# Patient Record
Sex: Male | Born: 1980
Health system: Southern US, Community
[De-identification: ages and names within clinical notes are randomized; demographics above are authoritative.]

## PROBLEM LIST (undated history)

## (undated) DIAGNOSIS — E7800 Pure hypercholesterolemia, unspecified: Secondary | ICD-10-CM

## (undated) DIAGNOSIS — G4730 Sleep apnea, unspecified: Secondary | ICD-10-CM

## (undated) DIAGNOSIS — T7840XA Allergy, unspecified, initial encounter: Secondary | ICD-10-CM

## (undated) DIAGNOSIS — E669 Obesity, unspecified: Secondary | ICD-10-CM

## (undated) DIAGNOSIS — K219 Gastro-esophageal reflux disease without esophagitis: Secondary | ICD-10-CM

## (undated) DIAGNOSIS — J45909 Unspecified asthma, uncomplicated: Secondary | ICD-10-CM

## (undated) HISTORY — DX: Allergy, unspecified, initial encounter: T78.40XA

## (undated) HISTORY — DX: Obesity, unspecified: E66.9

## (undated) HISTORY — PX: KNEE ARTHROSCOPY: SUR90

## (undated) HISTORY — PX: WISDOM TOOTH EXTRACTION: SHX21

## (undated) HISTORY — DX: Gastro-esophageal reflux disease without esophagitis: K21.9

---

## 1998-10-20 ENCOUNTER — Emergency Department (HOSPITAL_COMMUNITY): Admission: EM | Admit: 1998-10-20 | Discharge: 1998-10-21 | Payer: Self-pay | Admitting: Emergency Medicine

## 1998-10-21 ENCOUNTER — Encounter: Payer: Self-pay | Admitting: Internal Medicine

## 2000-05-09 ENCOUNTER — Encounter: Payer: Self-pay | Admitting: Internal Medicine

## 2000-05-09 ENCOUNTER — Ambulatory Visit (HOSPITAL_COMMUNITY): Admission: RE | Admit: 2000-05-09 | Discharge: 2000-05-09 | Payer: Self-pay | Admitting: Internal Medicine

## 2000-09-17 ENCOUNTER — Emergency Department (HOSPITAL_COMMUNITY): Admission: EM | Admit: 2000-09-17 | Discharge: 2000-09-18 | Payer: Self-pay | Admitting: Emergency Medicine

## 2000-09-18 ENCOUNTER — Encounter: Payer: Self-pay | Admitting: Emergency Medicine

## 2001-06-16 ENCOUNTER — Encounter: Payer: Self-pay | Admitting: Emergency Medicine

## 2001-06-16 ENCOUNTER — Emergency Department (HOSPITAL_COMMUNITY): Admission: EM | Admit: 2001-06-16 | Discharge: 2001-06-16 | Payer: Self-pay | Admitting: Emergency Medicine

## 2001-06-18 ENCOUNTER — Emergency Department (HOSPITAL_COMMUNITY): Admission: EM | Admit: 2001-06-18 | Discharge: 2001-06-18 | Payer: Self-pay | Admitting: *Deleted

## 2001-06-20 ENCOUNTER — Emergency Department (HOSPITAL_COMMUNITY): Admission: EM | Admit: 2001-06-20 | Discharge: 2001-06-20 | Payer: Self-pay | Admitting: *Deleted

## 2001-06-26 ENCOUNTER — Emergency Department (HOSPITAL_COMMUNITY): Admission: EM | Admit: 2001-06-26 | Discharge: 2001-06-26 | Payer: Self-pay | Admitting: *Deleted

## 2001-10-12 ENCOUNTER — Encounter: Admission: RE | Admit: 2001-10-12 | Discharge: 2001-10-12 | Payer: Self-pay | Admitting: Internal Medicine

## 2001-10-12 ENCOUNTER — Encounter: Payer: Self-pay | Admitting: Internal Medicine

## 2002-11-25 ENCOUNTER — Emergency Department (HOSPITAL_COMMUNITY): Admission: EM | Admit: 2002-11-25 | Discharge: 2002-11-25 | Payer: Self-pay | Admitting: Emergency Medicine

## 2002-11-27 ENCOUNTER — Emergency Department (HOSPITAL_COMMUNITY): Admission: EM | Admit: 2002-11-27 | Discharge: 2002-11-27 | Payer: Self-pay | Admitting: Emergency Medicine

## 2006-05-03 ENCOUNTER — Emergency Department (HOSPITAL_COMMUNITY): Admission: EM | Admit: 2006-05-03 | Discharge: 2006-05-03 | Payer: Self-pay | Admitting: Emergency Medicine

## 2007-04-07 ENCOUNTER — Emergency Department (HOSPITAL_COMMUNITY): Admission: EM | Admit: 2007-04-07 | Discharge: 2007-04-07 | Payer: Self-pay | Admitting: Family Medicine

## 2007-07-27 ENCOUNTER — Emergency Department (HOSPITAL_COMMUNITY): Admission: EM | Admit: 2007-07-27 | Discharge: 2007-07-27 | Payer: Self-pay | Admitting: Emergency Medicine

## 2007-12-16 ENCOUNTER — Emergency Department (HOSPITAL_COMMUNITY): Admission: EM | Admit: 2007-12-16 | Discharge: 2007-12-16 | Payer: Self-pay | Admitting: Emergency Medicine

## 2008-04-22 ENCOUNTER — Emergency Department (HOSPITAL_COMMUNITY): Admission: EM | Admit: 2008-04-22 | Discharge: 2008-04-22 | Payer: Self-pay | Admitting: Family Medicine

## 2008-08-13 ENCOUNTER — Emergency Department (HOSPITAL_COMMUNITY): Admission: EM | Admit: 2008-08-13 | Discharge: 2008-08-13 | Payer: Self-pay | Admitting: Emergency Medicine

## 2008-12-05 ENCOUNTER — Telehealth: Payer: Self-pay | Admitting: Internal Medicine

## 2008-12-09 ENCOUNTER — Ambulatory Visit: Payer: Self-pay | Admitting: Internal Medicine

## 2008-12-09 DIAGNOSIS — K219 Gastro-esophageal reflux disease without esophagitis: Secondary | ICD-10-CM

## 2008-12-09 DIAGNOSIS — R112 Nausea with vomiting, unspecified: Secondary | ICD-10-CM

## 2008-12-09 HISTORY — DX: Gastro-esophageal reflux disease without esophagitis: K21.9

## 2008-12-09 HISTORY — DX: Nausea with vomiting, unspecified: R11.2

## 2008-12-18 ENCOUNTER — Ambulatory Visit: Payer: Self-pay | Admitting: Internal Medicine

## 2008-12-18 DIAGNOSIS — K2970 Gastritis, unspecified, without bleeding: Secondary | ICD-10-CM | POA: Insufficient documentation

## 2008-12-18 DIAGNOSIS — K2990 Gastroduodenitis, unspecified, without bleeding: Secondary | ICD-10-CM

## 2008-12-18 HISTORY — DX: Gastroduodenitis, unspecified, without bleeding: K29.90

## 2008-12-18 HISTORY — DX: Gastritis, unspecified, without bleeding: K29.70

## 2008-12-19 ENCOUNTER — Telehealth (INDEPENDENT_AMBULATORY_CARE_PROVIDER_SITE_OTHER): Payer: Self-pay | Admitting: *Deleted

## 2008-12-20 ENCOUNTER — Encounter: Payer: Self-pay | Admitting: Internal Medicine

## 2009-05-10 ENCOUNTER — Emergency Department (HOSPITAL_COMMUNITY): Admission: EM | Admit: 2009-05-10 | Discharge: 2009-05-10 | Payer: Self-pay | Admitting: Family Medicine

## 2009-05-13 ENCOUNTER — Encounter: Payer: Self-pay | Admitting: Internal Medicine

## 2009-06-05 ENCOUNTER — Encounter: Payer: Self-pay | Admitting: Internal Medicine

## 2009-07-16 ENCOUNTER — Telehealth: Payer: Self-pay | Admitting: Internal Medicine

## 2009-07-30 ENCOUNTER — Telehealth: Payer: Self-pay | Admitting: Internal Medicine

## 2009-07-31 ENCOUNTER — Ambulatory Visit (HOSPITAL_COMMUNITY): Admission: EM | Admit: 2009-07-31 | Discharge: 2009-08-01 | Payer: Self-pay | Admitting: Emergency Medicine

## 2009-07-31 ENCOUNTER — Telehealth: Payer: Self-pay | Admitting: Internal Medicine

## 2009-07-31 ENCOUNTER — Ambulatory Visit: Payer: Self-pay | Admitting: Gastroenterology

## 2009-08-06 ENCOUNTER — Ambulatory Visit (HOSPITAL_COMMUNITY): Admission: RE | Admit: 2009-08-06 | Discharge: 2009-08-06 | Payer: Self-pay | Admitting: Internal Medicine

## 2009-08-11 ENCOUNTER — Telehealth: Payer: Self-pay | Admitting: Internal Medicine

## 2009-09-25 ENCOUNTER — Emergency Department (HOSPITAL_COMMUNITY): Admission: EM | Admit: 2009-09-25 | Discharge: 2009-09-25 | Payer: Self-pay | Admitting: Family Medicine

## 2010-07-13 ENCOUNTER — Emergency Department (HOSPITAL_COMMUNITY)
Admission: EM | Admit: 2010-07-13 | Discharge: 2010-07-13 | Payer: Self-pay | Source: Home / Self Care | Admitting: Emergency Medicine

## 2010-09-06 ENCOUNTER — Encounter: Payer: Self-pay | Admitting: Internal Medicine

## 2010-09-07 ENCOUNTER — Encounter: Payer: Self-pay | Admitting: Internal Medicine

## 2010-10-27 LAB — CBC
HCT: 42.8 % (ref 39.0–52.0)
Hemoglobin: 14.8 g/dL (ref 13.0–17.0)
MCH: 28.8 pg (ref 26.0–34.0)
MCHC: 34.6 g/dL (ref 30.0–36.0)
RBC: 5.13 MIL/uL (ref 4.22–5.81)

## 2010-10-27 LAB — COMPREHENSIVE METABOLIC PANEL
ALT: 34 U/L (ref 0–53)
AST: 27 U/L (ref 0–37)
CO2: 25 mEq/L (ref 19–32)
Chloride: 107 mEq/L (ref 96–112)
GFR calc Af Amer: >60 mL/min (ref >60)
GFR calc non Af Amer: >60 mL/min (ref >60)
Glucose, Bld: 105 mg/dL — ABNORMAL HIGH (ref 70–99)
Sodium: 138 mEq/L (ref 135–145)
Total Bilirubin: 0.4 mg/dL (ref 0.3–1.2)

## 2010-10-27 LAB — POCT CARDIAC MARKERS
CKMB, poc: 1.6 ng/mL (ref 1.0–8.0)
Troponin i, poc: <0.05 ng/mL (ref 0.00–0.09)

## 2010-11-16 LAB — CBC
HCT: 43.8 % (ref 39.0–52.0)
Hemoglobin: 14.8 g/dL (ref 13.0–17.0)
Platelets: 214 10*3/uL (ref 150–400)
RBC: 5.04 MIL/uL (ref 4.22–5.81)
WBC: 5.5 10*3/uL (ref 4.0–10.5)

## 2010-11-16 LAB — COMPREHENSIVE METABOLIC PANEL
AST: 21 U/L (ref 0–37)
CO2: 27 mEq/L (ref 19–32)
Chloride: 104 mEq/L (ref 96–112)
Creatinine, Ser: 0.99 mg/dL (ref 0.4–1.5)
GFR calc Af Amer: >60 mL/min (ref >60)
GFR calc non Af Amer: >60 mL/min (ref >60)
Glucose, Bld: 83 mg/dL (ref 70–99)
Total Bilirubin: 0.6 mg/dL (ref 0.3–1.2)

## 2010-11-16 LAB — DIFFERENTIAL
Eosinophils Relative: 1 % (ref 0–5)
Lymphocytes Relative: 38 % (ref 12–46)
Lymphs Abs: 2.1 10*3/uL (ref 0.7–4.0)
Monocytes Absolute: 0.4 10*3/uL (ref 0.1–1.0)
Monocytes Relative: 7 % (ref 3–12)

## 2011-01-13 ENCOUNTER — Encounter: Payer: Self-pay | Admitting: Internal Medicine

## 2011-01-15 NOTE — Progress Notes (Signed)
error    This encounter was created in error - please disregard.

## 2011-05-21 LAB — BASIC METABOLIC PANEL
BUN: 12 mg/dL (ref 6–23)
Calcium: 9.2 mg/dL (ref 8.4–10.5)
GFR calc non Af Amer: >60 mL/min (ref >60)
Glucose, Bld: 97 mg/dL (ref 70–99)
Sodium: 137 mEq/L (ref 135–145)

## 2011-05-21 LAB — D-DIMER, QUANTITATIVE: D-Dimer, Quant: 0.62 ug/mL-FEU — ABNORMAL HIGH (ref 0.00–0.48)

## 2011-05-21 LAB — POCT CARDIAC MARKERS
CKMB, poc: <1 ng/mL — ABNORMAL LOW (ref 1.0–8.0)
Myoglobin, poc: 69.7 ng/mL (ref 12–200)

## 2011-05-21 LAB — DIFFERENTIAL
Basophils Absolute: 0 10*3/uL (ref 0.0–0.1)
Lymphocytes Relative: 10 % — ABNORMAL LOW (ref 12–46)
Neutro Abs: 8.2 10*3/uL — ABNORMAL HIGH (ref 1.7–7.7)

## 2011-05-21 LAB — CBC
Hemoglobin: 14.4 g/dL (ref 13.0–17.0)
Platelets: 208 10*3/uL (ref 150–400)
RDW: 12.9 % (ref 11.5–15.5)

## 2011-10-04 ENCOUNTER — Other Ambulatory Visit: Payer: Self-pay

## 2011-10-04 ENCOUNTER — Encounter (HOSPITAL_COMMUNITY): Payer: Self-pay | Admitting: Emergency Medicine

## 2011-10-04 ENCOUNTER — Emergency Department (HOSPITAL_COMMUNITY)
Admission: EM | Admit: 2011-10-04 | Discharge: 2011-10-04 | Disposition: A | Discharge status: Home / Self Care | Payer: Managed Care, Other (non HMO) | Attending: Emergency Medicine | Admitting: Emergency Medicine

## 2011-10-04 ENCOUNTER — Emergency Department (HOSPITAL_COMMUNITY): Payer: Managed Care, Other (non HMO)

## 2011-10-04 DIAGNOSIS — R079 Chest pain, unspecified: Secondary | ICD-10-CM | POA: Insufficient documentation

## 2011-10-04 DIAGNOSIS — J40 Bronchitis, not specified as acute or chronic: Secondary | ICD-10-CM | POA: Insufficient documentation

## 2011-10-04 DIAGNOSIS — E669 Obesity, unspecified: Secondary | ICD-10-CM | POA: Insufficient documentation

## 2011-10-04 DIAGNOSIS — E7800 Pure hypercholesterolemia, unspecified: Secondary | ICD-10-CM | POA: Insufficient documentation

## 2011-10-04 DIAGNOSIS — IMO0001 Reserved for inherently not codable concepts without codable children: Secondary | ICD-10-CM | POA: Insufficient documentation

## 2011-10-04 DIAGNOSIS — K219 Gastro-esophageal reflux disease without esophagitis: Secondary | ICD-10-CM | POA: Insufficient documentation

## 2011-10-04 DIAGNOSIS — Z79899 Other long term (current) drug therapy: Secondary | ICD-10-CM | POA: Insufficient documentation

## 2011-10-04 HISTORY — DX: Pure hypercholesterolemia, unspecified: E78.00

## 2011-10-04 LAB — BASIC METABOLIC PANEL
BUN: 17 mg/dL (ref 6–23)
Calcium: 9.1 mg/dL (ref 8.4–10.5)
GFR calc Af Amer: >90 mL/min (ref >90)
GFR calc non Af Amer: >90 mL/min (ref >90)
Potassium: 3.7 mEq/L (ref 3.5–5.1)
Sodium: 136 mEq/L (ref 135–145)

## 2011-10-04 LAB — DIFFERENTIAL
Basophils Relative: 0 % (ref 0–1)
Eosinophils Absolute: 0 10*3/uL (ref 0.0–0.7)
Neutrophils Relative %: 67 % (ref 43–77)

## 2011-10-04 LAB — CBC
MCH: 27.6 pg (ref 26.0–34.0)
MCHC: 33.8 g/dL (ref 30.0–36.0)
Platelets: 191 10*3/uL (ref 150–400)

## 2011-10-04 MED ORDER — IOHEXOL 350 MG/ML SOLN
100.0000 mL | Freq: Once | INTRAVENOUS | Status: AC | PRN
  Administered 2011-10-04: 100 mL via INTRAVENOUS

## 2011-10-04 MED ORDER — ONDANSETRON 4 MG PO TBDP
8.0000 mg | ORAL_TABLET | Freq: Once | ORAL | Status: AC
  Administered 2011-10-04: 8 mg via ORAL
  Filled 2011-10-04: qty 2

## 2011-10-04 MED ORDER — HYDROCOD POLST-CHLORPHEN POLST 10-8 MG/5ML PO LQCR: 5.0000 mL | Freq: Two times a day (BID) | ORAL | Status: DC

## 2011-10-04 MED ORDER — ONDANSETRON HCL 4 MG/2ML IJ SOLN
INTRAMUSCULAR | Status: AC
  Administered 2011-10-04: 15:00:00
  Filled 2011-10-04: qty 2

## 2011-10-04 NOTE — Discharge Instructions (Signed)
Bronchitis     Bronchitis is a problem of the air tubes leading to your lungs. This problem makes it hard for air to get in and out of the lungs. You may cough a lot because your air tubes are narrow. Going without care can cause lasting (chronic) bronchitis.  HOME CARE   · Drink enough fluids to keep your pee (urine) clear or pale yellow.   · Use a cool mist humidifier.   · Quit smoking if you smoke. If you keep smoking, the bronchitis might not get better.   · Only take medicine as told by your doctor.   GET HELP RIGHT AWAY IF:   · Coughing keeps you awake.   · You start to wheeze.   · You become more sick or weak.   · You have a hard time breathing or get short of breath.   · You cough up blood.   · Coughing lasts more than 2 weeks.   · You have a fever.   · Your baby is older than 3 months with a rectal temperature of 102° F (38.9° C) or higher.   · Your baby is 3 months old or younger with a rectal temperature of 100.4° F (38° C) or higher.   MAKE SURE YOU:  · Understand these instructions.   · Will watch your condition.   · Will get help right away if you are not doing well or get worse.   Document Released: 01/19/2008 Document Revised: 04/14/2011 Document Reviewed: 07/04/2009  Exit[employer]® Patient Information ©2012 [employer].

## 2011-10-04 NOTE — ED Notes (Signed)
Pt returned from xray

## 2011-10-04 NOTE — ED Notes (Signed)
Pt c/o flu sx with cough x fever x 1 week; pt sts was seen by PCP on Saturday for flu; pt sts CP worse with inspiration and cough starting last night

## 2011-10-04 NOTE — ED Provider Notes (Signed)
History     CSN: [id number]  Arrival date & time 10/04/11  1247   First MD Initiated Contact with Patient 10/04/11 1334      Chief Complaint  Patient presents with  . Chest Pain  . Influenza    (Consider location/radiation/quality/duration/timing/severity/associated sxs/prior treatment) HPI Comments: Presents to the emergency department today with a four-day history of cough and nasal congestion. He started with nasal congestion per progress to cough which has been productive of yellow sputum he did have some hemoptysis yesterday but none today. She says he hasn't had too much of a cough. He grossly had myalgias and fevers at home. He was seen by his primary care physician at [facility] 2 days ago at that week in clinic and was diagnosed with influenza was started on Avelox and Tamiflu. He says he came in today because he's had some worsening shortness of breath and pain to the left side of his chest. The pain has been sharp and constant since yesterday it is worse with coughing and deep breathing it is also worse worse with movement. He's had some nausea and vomiting as well  Patient is a 31 y.o. male presenting with chest pain and flu symptoms. The history is provided by the patient.  Chest Pain Primary symptoms include a fever, fatigue, shortness of breath and cough. Pertinent negatives for primary symptoms include no wheezing, no abdominal pain, no nausea, no vomiting and no dizziness.  Pertinent negatives for associated symptoms include no diaphoresis, no numbness and no weakness.    Influenza Associated symptoms include chest pain and shortness of breath. Pertinent negatives include no abdominal pain and no headaches.    Past Medical History  Diagnosis Date  . Obesity   . GERD (gastroesophageal reflux disease)   . Hypercholesterolemia     Past Surgical History  Procedure Date  . Knee arthroscopy     Family History  Problem Relation Age of Onset  . Breast cancer  Mother   . Diabetes      History  Substance Use Topics  . Smoking status: Never Smoker   . Smokeless tobacco: Not on file  . Alcohol Use: No      Review of Systems  Constitutional: Positive for fever and fatigue. Negative for chills and diaphoresis.  HENT: Positive for congestion, sore throat, rhinorrhea and postnasal drip. Negative for sneezing.   Eyes: Negative.   Respiratory: Positive for cough and shortness of breath. Negative for chest tightness and wheezing.   Cardiovascular: Positive for chest pain. Negative for leg swelling.  Gastrointestinal: Negative for nausea, vomiting, abdominal pain, diarrhea and blood in stool.  Genitourinary: Negative for frequency, hematuria, flank pain and difficulty urinating.  Musculoskeletal: Positive for myalgias. Negative for back pain and arthralgias.  Skin: Negative for rash.  Neurological: Negative for dizziness, speech difficulty, weakness, numbness and headaches.    Allergies  Amoxicillin  Home Medications   Current Outpatient Rx  Name Route Sig Dispense Refill  . ESOMEPRAZOLE MAGNESIUM 40 MG PO CPDR Oral Take 40 mg by mouth 2 (two) times daily.      Marland Kitchen MOXIFLOXACIN HCL 400 MG PO TABS Oral Take 400 mg by mouth daily. X 7 days. Started on 10/01/16    . OSELTAMIVIR PHOSPHATE 75 MG PO CAPS Oral Take 75 mg by mouth 2 (two) times daily. X 5 days. Started on 10/02/11    . SIMVASTATIN 10 MG PO TABS Oral Take 10 mg by mouth at bedtime.    Marland Kitchen HYDROCOD POLST-CPM  POLST ER 10-8 MG/5ML PO LQCR Oral Take 5 mLs by mouth every 12 (twelve) hours. 140 mL 0    BP 112/72  Pulse 108  Temp(Src) 98.7 F (37.1 C) (Oral)  Resp 16  SpO2 96%  Physical Exam  Constitutional: He is oriented to person, place, and time. He appears well-developed and well-nourished.  HENT:  Head: Normocephalic and atraumatic.  Right Ear: External ear normal.  Left Ear: External ear normal.  Mouth/Throat: Oropharynx is clear and moist.  Eyes: Pupils are equal, round, and  reactive to light.  Neck: Normal range of motion. Neck supple.  Cardiovascular: Normal rate, regular rhythm and normal heart sounds.   Pulmonary/Chest: Effort normal and breath sounds normal. No respiratory distress. He has no wheezes. He has no rales. He exhibits tenderness.       Positive tenderness that is reproducible on palpation the left sternal edge.  Abdominal: Soft. Bowel sounds are normal. There is no tenderness. There is no rebound and no guarding.  Musculoskeletal: Normal range of motion. He exhibits tenderness. He exhibits no edema.       No specific tenderness to the posterior calf. He does have diffuse tenderness on palpation musculature but this is all over he says he is just been very achy all over.  Lymphadenopathy:    He has no cervical adenopathy.  Neurological: He is alert and oriented to person, place, and time.  Skin: Skin is warm and dry. No rash noted.  Psychiatric: He has a normal mood and affect.    ED Course  Procedures (including critical care time)   Date: 10/04/2011  Rate: 117  Rhythm: normal sinus rhythm  QRS Axis: normal  Intervals: normal  ST/T Wave abnormalities: nonspecific ST changes  Conduction Disutrbances:none  Narrative Interpretation:   Old EKG Reviewed: unchanged   Results for orders placed during the hospital encounter of 10/04/11  CBC      Component Value Range   WBC 7.1  4.0 - 10.5 (K/uL)   RBC 5.00  4.22 - 5.81 (MIL/uL)   Hemoglobin 13.8  13.0 - 17.0 (g/dL)   HCT 16.1  09.6 - 04.5 (%)   MCV 81.6  78.0 - 100.0 (fL)   MCH 27.6  26.0 - 34.0 (pg)   MCHC 33.8  30.0 - 36.0 (g/dL)   RDW 40.9  81.1 - 91.4 (%)   Platelets 191  150 - 400 (K/uL)  DIFFERENTIAL      Component Value Range   Neutrophils Relative 67  43 - 77 (%)   Neutro Abs 4.8  1.7 - 7.7 (K/uL)   Lymphocytes Relative 16  12 - 46 (%)   Lymphs Abs 1.2  0.7 - 4.0 (K/uL)   Monocytes Relative 16 (*) 3 - 12 (%)   Monocytes Absolute 1.2 (*) 0.1 - 1.0 (K/uL)   Eosinophils  Relative 0  0 - 5 (%)   Eosinophils Absolute 0.0  0.0 - 0.7 (K/uL)   Basophils Relative 0  0 - 1 (%)   Basophils Absolute 0.0  0.0 - 0.1 (K/uL)  BASIC METABOLIC PANEL      Component Value Range   Sodium 136  135 - 145 (mEq/L)   Potassium 3.7  3.5 - 5.1 (mEq/L)   Chloride 98  96 - 112 (mEq/L)   CO2 24  19 - 32 (mEq/L)   Glucose, Bld 90  70 - 99 (mg/dL)   BUN 17  6 - 23 (mg/dL)   Creatinine, Ser 7.82  0.50 - 1.35 (  mg/dL)   Calcium 9.1  8.4 - 16.1 (mg/dL)   GFR calc non Af Amer >90  >90 (mL/min)   GFR calc Af Amer >90  >90 (mL/min)   Dg Chest 2 View  10/04/2011  *RADIOLOGY REPORT*  Clinical Data: Mid chest pain.  Shortness of breath.  Generalized weakness.  Body aches.  Worsening symptoms over the past 3-4 days.  CHEST - 2 VIEW 10/04/2011:  Comparison: Portable chest x-ray 07/13/2010, 08/13/2008, and two- view chest x-ray 07/27/2007 [facility].  CTA chest 08/13/2008 [facility].  Findings: Suboptimal inspiration accounts for crowded bronchovascular markings, especially in the lung bases, and accentuates the cardiac silhouette.  Taking this into account, cardiomediastinal silhouette unremarkable and lungs clear. Bronchovascular markings normal.  No pleural effusions.  Visualized bony thorax intact.  IMPRESSION: Suboptimal inspiration.  No acute cardiopulmonary disease.  Original Report Authenticated By: Arnell Sieving, M.D.   Ct Angio Chest W/cm &/or Wo Cm  10/04/2011  *RADIOLOGY REPORT*  Clinical Data: Left pleuritic chest pain, shortness of breath.  CT ANGIOGRAPHY CHEST  Technique:  Multidetector CT imaging of the chest using the standard protocol during bolus administration of intravenous contrast. Multiplanar reconstructed images including MIPs were obtained and reviewed to evaluate the vascular anatomy.  Contrast: OMNIPAQUE IOHEXOL 350 MG/ML IV SOLN  Comparison: 08/13/2008  Findings: No filling defects in the pulmonary arteries to suggest pulmonary emboli.  The Heart  is normal size. Aorta is normal caliber. No mediastinal, hilar, or axillary adenopathy.  Visualized thyroid and chest wall soft tissues unremarkable. Imaging into the upper abdomen shows no acute findings.  Lungs are clear.  No focal airspace opacities or suspicious nodules.  No effusions.  IMPRESSION: No acute findings.  No evidence of pulmonary embolus.  Original Report Authenticated By: Cyndie Chime, M.D.      1. Bronchitis       MDM  Pt doing better after IVFs, zofran.  No SOB.  No evidence of pneumonia/PE.  Will continue meds as per his PMD        Rolan Bucco, MD 10/04/11 1744

## 2011-10-04 NOTE — ED Notes (Signed)
Patient c/o cold/flu symptoms x 5 days.  Patient seen by primary MD on Saturday receiving tamiflu and avelox.  Patient states symptoms with no improvement. Patient also c/o chest pressure in midsternal region.  Patient with history of gerd and unable to take nexium x 5 days.

## 2011-10-12 ENCOUNTER — Ambulatory Visit (INDEPENDENT_AMBULATORY_CARE_PROVIDER_SITE_OTHER): Payer: Managed Care, Other (non HMO) | Admitting: Nurse Practitioner

## 2011-10-12 ENCOUNTER — Telehealth: Payer: Self-pay | Admitting: Internal Medicine

## 2011-10-12 ENCOUNTER — Encounter: Payer: Self-pay | Admitting: Nurse Practitioner

## 2011-10-12 VITALS — BP 130/80 | HR 120 | Ht 67.5 in | Wt 270.4 lb

## 2011-10-12 DIAGNOSIS — R1310 Dysphagia, unspecified: Secondary | ICD-10-CM

## 2011-10-12 DIAGNOSIS — K219 Gastro-esophageal reflux disease without esophagitis: Secondary | ICD-10-CM

## 2011-10-12 NOTE — Telephone Encounter (Signed)
Dr. Nehemiah Settle requesting pt be seen asap for possible hernia. No appetite for 2 weeks. Pt scheduled to see Willette Cluster NP today at 3pm. Dora to notify pt of appt date and time. Records to be faxed for review.

## 2011-10-12 NOTE — Progress Notes (Signed)
[name] [id number] November 14, 1980   HISTORY OR PRESENT ILLNESS :  Patient is a 31 year old male seen by Dr. Marina Goodell May 2010 for acid reflux problems. Upper endoscopy following that visit showed mild gastritis and a moderate size hiatal hernia. Gastric emptying study late December 2010 was normal.  Patient had another upper endoscopy Dec 2010 after being evaluated in emergency department for nausea, vomiting and coffee-ground emesis. Findings included submucosal and mucosal hemorrhages in the cardia and fundus.  Patient still continues to have problems with acid reflux and heartburn, especially with spicy food or if he eats late. He frequently has regurgitation, especially at night.  He complains of intermittent solid food dysphagia. Food usually goes down with extra liquids. Also complains of frequent "gagging" when preaching, laughing etc..Sometimes gagging results in actual vomiting, other times dry heaves. No significant nausea.  Over the last week patient has had 5 episodes of low volume hematemesis. He doesn't take NSAIDS. Patient has been on a daily PPI since his last visit with [place] in 2010 .    [name] had the flu about a week ago with fever, body aches, decreased appetite. He describes a 26 pound weight loss in relationship to that illness. Patient developed odynophagia during the time of this flu like illness. He has taken Avelox within the last few weeks but that was after the onset of painful swallowing. Prior to that the patient had antibiotics sometime in December. PCP has treated him with Carafate and magic mouthwash but so far no significant improvement in painful swallowing.  Patient saw PCP today, he increased patient's Nexium to BID and referred him to our office.    Current Medications, Allergies, Past Medical History, Past Surgical History, Family History and Social History were reviewed in [employer] record.   PHYSICAL EXAMINATION : General: Well  developed black male in no acute distress Head: Normocephalic and atraumatic Eyes:  sclerae anicteric,conjunctive pink. Ears: Normal auditory acuity Neck: Supple, no masses. Mouth:  No obvious candida. There is thin white covering on the tongue but does not appear to be candida  Lungs: Clear throughout to auscultation Heart: Regular rate and rhythm; no murmurs heard Abdomen: Soft, nondistended, nontender. No masses or hepatomegaly noted. Normal bowel sounds Rectal: not done Musculoskeletal: Symmetrical with no gross deformities  Skin: No lesions on visible extremities Extremities: No edema or deformities noted Neurological: Oriented, grossly nonfocal Cervical Nodes:  No significant cervical adenopathy Psychological:  Alert and cooperative. Normal mood and affect  ASSESSMENT AND PLAN : 52.  32 year old male with chronic GERD presenting with progressive symptoms . Patient has heartburn, regurgitation and frequent episodes of heaving / vomiting in the absence of nausea. Agree with increasing PPI to twice daily as primary care physician did today. For further evaluation of progressive GERD symptoms, as well as solid food dysphasia and odynophagia, patient will be scheduled for an upper endoscopy with Dr. Marina Goodell.The benefits, risks, and potential complications of EGD with possible biopsies and/or dilation were discussed with the patient and he agrees to proceed.   2. Intermittent solid food dysphagia, possibly esophagitis, possibly esophageal ring/stricture.  Further recommendation at time of EGD.  3.  Odynophagia described as esophageal burning. So far magic mouthwash not helping. Still need to exclude candida esophagitis. Rule out esophageal ulcers. Further recommendation at time of EGD.  4. Small volume hematemesis. Suspect secondary to esophagitis, possibly small Mallory-Weiss tear. The patient is certainly stable, looks well. Further recommendation following EGD.

## 2011-10-12 NOTE — Patient Instructions (Addendum)
We have given you information on GERD, Gastroesophageal Reflux Disease. We scheduled the EGD PROPOFOL with Dr Marina Goodell for [facility] 10-13-2011. Directions and brochure provided.

## 2011-10-13 ENCOUNTER — Ambulatory Visit (AMBULATORY_SURGERY_CENTER): Payer: Managed Care, Other (non HMO) | Admitting: Internal Medicine

## 2011-10-13 ENCOUNTER — Encounter: Payer: Self-pay | Admitting: Internal Medicine

## 2011-10-13 DIAGNOSIS — R1310 Dysphagia, unspecified: Secondary | ICD-10-CM

## 2011-10-13 DIAGNOSIS — K208 Other esophagitis without bleeding: Secondary | ICD-10-CM

## 2011-10-13 DIAGNOSIS — K2210 Ulcer of esophagus without bleeding: Secondary | ICD-10-CM

## 2011-10-13 DIAGNOSIS — K219 Gastro-esophageal reflux disease without esophagitis: Secondary | ICD-10-CM

## 2011-10-13 MED ORDER — SODIUM CHLORIDE 0.9 % IV SOLN: 500.0000 mL | INTRAVENOUS | Status: DC

## 2011-10-13 NOTE — Op Note (Signed)
[facility] [address] [place], [place]  [address]  ENDOSCOPY PROCEDURE REPORT  PATIENT:  [name], [name]  MR#:  [id number] BIRTHDATE:  [birth year], 30 yrs. old  GENDER:  male  ENDOSCOPIST:  Wilhemina Bonito. Eda Keys, MD Referred by:  Office  PROCEDURE DATE:  10/13/2011 PROCEDURE:  EGD with biopsy, 19147 ASA CLASS:  Class II INDICATIONS:  odynophagia, dysphagia, hematemesis, GERD  MEDICATIONS:   MAC sedation, administered by CRNA, propofol (Diprivan) 200 mg IV TOPICAL ANESTHETIC:  none  DESCRIPTION OF PROCEDURE:   After the risks benefits and alternatives of the procedure were thoroughly explained, informed consent was obtained.  The LB [id number]-H180 [id number] endoscope was introduced through the mouth and advanced to the second portion of the duodenum, without limitations.  The instrument was slowly withdrawn as the mucosa was fully examined. <<PROCEDUREIMAGES>>  Multiple punctate small ulcers were found in the mid esophagus. Bx taken. Erosive reflux Esophagitis was found in the distal esophagus.  Otherwise the examination was normal to D2. Retroflexed views revealed no abnormalities.    The scope was then withdrawn from the patient and the procedure completed.  COMPLICATIONS:  None  ENDOSCOPIC IMPRESSION: 1) Ulcers, multiple in the mid esophagus (? infection vs pill) 2) Esophagitis in the distal esophagus 3) Otherwise normal examination 4) Gerd  RECOMMENDATIONS: 1) Anti-reflux regimen to be followed 2) Await biopsy results 3) CONTINUE NEXIUM TWICE DAILY 4) Call office next 2-3 days to schedule an office appointment for 4 WEEKS  ______________________________ Wilhemina Bonito. Eda Keys, MD  CC:  The Patient;  Nila Nephew, MD  n. Rosalie DoctorWilhemina Bonito. Eda Keys at 10/13/2011 04:49 PM  Armanda Heritage, [phone]

## 2011-10-13 NOTE — Progress Notes (Signed)
I agree with assessment and plans 

## 2011-10-13 NOTE — Patient Instructions (Signed)

## 2011-10-13 NOTE — Progress Notes (Signed)
Patient did not have preoperative order for IV antibiotic SSI prophylaxis. (G8918)  Patient did not experience any of the following events: a burn prior to discharge; a fall within the facility; wrong site/side/patient/procedure/implant event; or a hospital transfer or hospital admission upon discharge from the facility. (G8907)  

## 2011-10-13 NOTE — Progress Notes (Signed)
Per the pt he lost 29lb within 16 days.  Pt recently had flu.  That was the beginning of losing his appetite.  When he ate he had vomiting and gagging and blood was seen in the vomitus but he could not remember what color the blood was.  Last episode was this past Sunday 10/10/2011.  maw

## 2011-10-14 ENCOUNTER — Telehealth: Payer: Self-pay

## 2011-10-14 NOTE — Telephone Encounter (Signed)
Left message on answering machine. 

## 2011-10-20 ENCOUNTER — Encounter: Payer: Self-pay | Admitting: Internal Medicine

## 2011-11-10 ENCOUNTER — Ambulatory Visit: Payer: Managed Care, Other (non HMO) | Admitting: Internal Medicine

## 2011-11-23 ENCOUNTER — Ambulatory Visit: Payer: Managed Care, Other (non HMO) | Admitting: Internal Medicine

## 2012-05-05 ENCOUNTER — Emergency Department (HOSPITAL_COMMUNITY)
Admission: EM | Admit: 2012-05-05 | Discharge: 2012-05-05 | Disposition: A | Discharge status: Home / Self Care | Payer: Managed Care, Other (non HMO) | Source: Home / Self Care

## 2012-05-05 ENCOUNTER — Encounter (HOSPITAL_COMMUNITY): Payer: Self-pay

## 2012-05-05 DIAGNOSIS — J329 Chronic sinusitis, unspecified: Secondary | ICD-10-CM

## 2012-05-05 MED ORDER — CEFUROXIME AXETIL 250 MG PO TABS: 250.0000 mg | ORAL_TABLET | Freq: Two times a day (BID) | ORAL | Status: DC

## 2012-05-05 NOTE — ED Provider Notes (Signed)
History     CSN: [id number]  Arrival date & time 05/05/12  1209   None     Chief Complaint  Patient presents with  . Facial Pain    (Consider location/radiation/quality/duration/timing/severity/associated sxs/prior treatment) HPI Comments: This is a pleasant 31 year old man with a long history of chronic sinusitis. He states he has 3-4 infections per year. His complaint today is discomfort in the left side of the face as well his scalp tenderness in the parietal and vertex areas. This is typical discomfort when he has a sinus infection. Associated symptoms include post nasal discharge cough chills for the last 2 nights but no fever. He is taking Zyrtec D. and NyQuil for drainage and congestion and Robitussin-DM for cough. States that azithromycin no longer works for him as he has become resistant to it area   Past Medical History  Diagnosis Date  . Obesity   . GERD (gastroesophageal reflux disease)   . Hypercholesterolemia   . Allergy   . Esophagitis     Past Surgical History  Procedure Date  . Knee arthroscopy   . Wisdom tooth extraction     Family History  Problem Relation Age of Onset  . Breast cancer Mother   . Diabetes    . Lymphoma    . Colon cancer Maternal Grandfather   . Esophageal cancer Neg Hx   . Stomach cancer Neg Hx     History  Substance Use Topics  . Smoking status: Never Smoker   . Smokeless tobacco: Never Used  . Alcohol Use: No      Review of Systems  Constitutional: Positive for chills. Negative for fever and fatigue.  HENT:       As per history of present illness  Eyes: Negative for pain, discharge and redness.  Respiratory: Positive for cough. Negative for shortness of breath and wheezing.   Cardiovascular: Negative for chest pain, palpitations and leg swelling.  Musculoskeletal: Negative.   Skin: Negative for pallor and rash.  Neurological: Negative.     Allergies  Amoxicillin  Home Medications   Current Outpatient Rx    Name Route Sig Dispense Refill  . ESOMEPRAZOLE MAGNESIUM 40 MG PO CPDR Oral Take 40 mg by mouth 2 (two) times daily.      Marland Kitchen SIMVASTATIN 10 MG PO TABS Oral Take 10 mg by mouth at bedtime.    Marland Kitchen CEFUROXIME AXETIL 250 MG PO TABS Oral Take 1 tablet (250 mg total) by mouth 2 (two) times daily. 20 tablet 0  . CHERATUSSIN AC 100-10 MG/5ML PO SYRP Per post-pyloric tube by Per post-pyloric tube route as needed.    Marland Kitchen HYDROCOD POLST-CPM POLST ER 10-8 MG/5ML PO LQCR Oral Take 5 mLs by mouth every 12 (twelve) hours. 140 mL 0  . MOXIFLOXACIN HCL 400 MG PO TABS Oral Take 400 mg by mouth daily. X 7 days. Started on 10/01/16    . OSELTAMIVIR PHOSPHATE 75 MG PO CAPS Oral Take 75 mg by mouth 2 (two) times daily. X 5 days. Started on 10/02/11    . SUCRALFATE 1 GM/10ML PO SUSP Oral Take 1 g by mouth 2 (two) times daily.      BP 117/82  Pulse 92  Temp 99 F (37.2 C) (Oral)  Resp 20  SpO2 97%  Physical Exam  Constitutional: He is oriented to person, place, and time. He appears well-developed and well-nourished. No distress.  HENT:  Head: Normocephalic.  Right Ear: External ear normal.  Left Ear: External ear normal.  Mouth/Throat: Oropharynx is clear and moist.       Unable to visualize the oropharynx due  to large tongue for which he is unable to relax.  Eyes: EOM are normal. Pupils are equal, round, and reactive to light.  Neck: Normal range of motion. Neck supple.  Cardiovascular: Normal rate and normal heart sounds.   Pulmonary/Chest: Effort normal and breath sounds normal.  Musculoskeletal: Normal range of motion. He exhibits no edema and no tenderness.  Lymphadenopathy:    He has no cervical adenopathy.  Neurological: He is alert and oriented to person, place, and time. No cranial nerve deficit.  Skin: Skin is warm and dry.    ED Course  Procedures (including critical care time)  Labs Reviewed - No data to display No results found.   1. Sinusitis, chronic       MDM  Ceftin 250 mg one  twice a day x10 days. Noted allergy to amoxicillin refers to a sensation of burning on the inside that occurred 6 years ago. He denies having any problems with swelling rash itching or breathing. He states he has taken Ceftin in the past without problems. He has a PCP and has established with an ENT recently and is scheduled to followup with both of them.        Hayden Rasmussen, NP 05/05/12 1334

## 2012-05-05 NOTE — ED Notes (Signed)
C/o sinus issues x 3 days , facial pain, drainage, headache

## 2012-05-05 NOTE — ED Provider Notes (Signed)
Medical screening examination/treatment/procedure(s) were performed by non-physician practitioner and as supervising physician I was immediately available for consultation/collaboration.  Raynald Blend, MD 05/05/12 [phone]

## 2012-10-03 ENCOUNTER — Telehealth: Payer: Self-pay | Admitting: Internal Medicine

## 2012-10-03 ENCOUNTER — Other Ambulatory Visit: Payer: Self-pay | Admitting: Internal Medicine

## 2012-10-03 NOTE — Telephone Encounter (Signed)
Pt states that he has been vomiting blood today, states it is quite a bit of blood. Pt states he has been seen in the ER for this in the past. Pt instructed to go to the ER to be evaluated. Pt verbalized understanding.

## 2013-06-29 ENCOUNTER — Emergency Department (HOSPITAL_COMMUNITY)
Admission: EM | Admit: 2013-06-29 | Discharge: 2013-06-29 | Disposition: A | Discharge status: Home / Self Care | Payer: BC Managed Care – PPO | Attending: Emergency Medicine | Admitting: Emergency Medicine

## 2013-06-29 ENCOUNTER — Emergency Department (HOSPITAL_COMMUNITY): Payer: BC Managed Care – PPO

## 2013-06-29 DIAGNOSIS — E7800 Pure hypercholesterolemia, unspecified: Secondary | ICD-10-CM | POA: Insufficient documentation

## 2013-06-29 DIAGNOSIS — Z9119 Patient's noncompliance with other medical treatment and regimen: Secondary | ICD-10-CM | POA: Insufficient documentation

## 2013-06-29 DIAGNOSIS — R079 Chest pain, unspecified: Secondary | ICD-10-CM | POA: Insufficient documentation

## 2013-06-29 DIAGNOSIS — E669 Obesity, unspecified: Secondary | ICD-10-CM | POA: Insufficient documentation

## 2013-06-29 DIAGNOSIS — G8929 Other chronic pain: Secondary | ICD-10-CM | POA: Insufficient documentation

## 2013-06-29 DIAGNOSIS — K219 Gastro-esophageal reflux disease without esophagitis: Secondary | ICD-10-CM | POA: Insufficient documentation

## 2013-06-29 DIAGNOSIS — R61 Generalized hyperhidrosis: Secondary | ICD-10-CM | POA: Insufficient documentation

## 2013-06-29 DIAGNOSIS — R0602 Shortness of breath: Secondary | ICD-10-CM | POA: Insufficient documentation

## 2013-06-29 DIAGNOSIS — R609 Edema, unspecified: Secondary | ICD-10-CM | POA: Insufficient documentation

## 2013-06-29 DIAGNOSIS — R05 Cough: Secondary | ICD-10-CM | POA: Insufficient documentation

## 2013-06-29 DIAGNOSIS — R42 Dizziness and giddiness: Secondary | ICD-10-CM | POA: Insufficient documentation

## 2013-06-29 DIAGNOSIS — Z91199 Patient's noncompliance with other medical treatment and regimen due to unspecified reason: Secondary | ICD-10-CM | POA: Insufficient documentation

## 2013-06-29 DIAGNOSIS — R000 Tachycardia, unspecified: Secondary | ICD-10-CM | POA: Insufficient documentation

## 2013-06-29 DIAGNOSIS — Z881 Allergy status to other antibiotic agents status: Secondary | ICD-10-CM | POA: Insufficient documentation

## 2013-06-29 DIAGNOSIS — R112 Nausea with vomiting, unspecified: Secondary | ICD-10-CM | POA: Insufficient documentation

## 2013-06-29 DIAGNOSIS — Z79899 Other long term (current) drug therapy: Secondary | ICD-10-CM | POA: Insufficient documentation

## 2013-06-29 DIAGNOSIS — M549 Dorsalgia, unspecified: Secondary | ICD-10-CM | POA: Insufficient documentation

## 2013-06-29 DIAGNOSIS — R059 Cough, unspecified: Secondary | ICD-10-CM | POA: Insufficient documentation

## 2013-06-29 DIAGNOSIS — R062 Wheezing: Secondary | ICD-10-CM | POA: Insufficient documentation

## 2013-06-29 DIAGNOSIS — Z8719 Personal history of other diseases of the digestive system: Secondary | ICD-10-CM | POA: Insufficient documentation

## 2013-06-29 LAB — CBC WITH DIFFERENTIAL/PLATELET
Basophils Absolute: 0 10*3/uL (ref 0.0–0.1)
Eosinophils Absolute: 0 10*3/uL (ref 0.0–0.7)
Eosinophils Relative: 0 % (ref 0–5)
HCT: 40 % (ref 39.0–52.0)
Hemoglobin: 13.8 g/dL (ref 13.0–17.0)
Lymphocytes Relative: 12 % (ref 12–46)
Lymphs Abs: 1.1 10*3/uL (ref 0.7–4.0)
MCV: 82.8 fL (ref 78.0–100.0)
Monocytes Absolute: 0.6 10*3/uL (ref 0.1–1.0)
Monocytes Relative: 6 % (ref 3–12)
Neutro Abs: 7.7 10*3/uL (ref 1.7–7.7)
Neutrophils Relative %: 81 % — ABNORMAL HIGH (ref 43–77)
Platelets: 244 10*3/uL (ref 150–400)
RBC: 4.83 MIL/uL (ref 4.22–5.81)
RDW: 12.8 % (ref 11.5–15.5)
WBC: 9.5 10*3/uL (ref 4.0–10.5)

## 2013-06-29 LAB — COMPREHENSIVE METABOLIC PANEL
ALT: 27 U/L (ref 0–53)
AST: 26 U/L (ref 0–37)
Albumin: 3.8 g/dL (ref 3.5–5.2)
Calcium: 9.5 mg/dL (ref 8.4–10.5)
Creatinine, Ser: 0.89 mg/dL (ref 0.50–1.35)
GFR calc Af Amer: >90 mL/min (ref >90)
Glucose, Bld: 91 mg/dL (ref 70–99)
Potassium: 4.5 mEq/L (ref 3.5–5.1)
Sodium: 135 mEq/L (ref 135–145)
Total Protein: 8 g/dL (ref 6.0–8.3)

## 2013-06-29 LAB — POCT I-STAT TROPONIN I

## 2013-06-29 LAB — D-DIMER, QUANTITATIVE: D-Dimer, Quant: <0.27 ug/mL-FEU (ref 0.00–0.48)

## 2013-06-29 MED ORDER — ALBUTEROL SULFATE HFA 108 (90 BASE) MCG/ACT IN AERS
2.0000 | INHALATION_SPRAY | RESPIRATORY_TRACT | Status: DC | PRN
  Administered 2013-06-29: 2 via RESPIRATORY_TRACT
  Filled 2013-06-29: qty 6.7

## 2013-06-29 NOTE — ED Notes (Signed)
Pt states that he had chest pain x2 day; pt is sweaty and nausea; vomited today.

## 2013-06-29 NOTE — ED Notes (Signed)
Kandee Keen, EMT had to retrieve pt from outside.

## 2013-06-29 NOTE — ED Notes (Signed)
Retrieved pt from outside.

## 2013-06-29 NOTE — ED Notes (Signed)
Pt called for triage. No answer.

## 2013-06-29 NOTE — ED Notes (Signed)
Pt also reports coughing up brownish blood; has hx of this but reports it is more than usual. Reports feeling dizzy.

## 2013-06-29 NOTE — ED Notes (Signed)
1st cbc was clotted; reordered

## 2013-06-29 NOTE — ED Provider Notes (Signed)
CSN: [id number]     Arrival date & time 06/29/13  1350 History   First MD Initiated Contact with Patient 06/29/13 1529     Chief Complaint  Patient presents with  . Chest Pain   (Consider location/radiation/quality/duration/timing/severity/associated sxs/prior Treatment) HPI Comments: Patient with PMH of hypercholesterolemia, GERD -- presents with c/o left sided CP, sweating, vomiting, dizziness but no syncope, cough with brown sputum, bilateral lower extremity edema for two days. Symptoms were worse this morning. Patient admits intermittently takes his Nexium and Zocor. No h/o CAD or lung problems. He has been coughing and has noted wheezing. No PE risk factors such as recent surgery or immobilization, h/o PE, recent travel. No diarrhea, urinary symptoms. No significant FH of CAD. Does not smoke, denies drug use. 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 No tx PTA. The onset of this condition was acute. The course is constant. Aggravating factors: none. Alleviating factors: none.     The history is provided by the patient.    Past Medical History  Diagnosis Date  . Obesity   . GERD (gastroesophageal reflux disease)   . Hypercholesterolemia   . Allergy   . Esophagitis    Past Surgical History  Procedure Laterality Date  . Knee arthroscopy    . Wisdom tooth extraction     Family History  Problem Relation Age of Onset  . Breast cancer Mother   . Diabetes    . Lymphoma    . Colon cancer Maternal Grandfather   . Esophageal cancer Neg Hx   . Stomach cancer Neg Hx    History  Substance Use Topics  . Smoking status: Never Smoker   . Smokeless tobacco: Never Used  . Alcohol Use: No    Review of Systems  Constitutional: Negative for fever and diaphoresis.  HENT: Negative for congestion and sore throat.   Eyes: Negative for redness.  Respiratory: Positive for cough and wheezing. Negative for shortness of breath.   Cardiovascular: Positive for chest pain  and leg swelling (bilateral). Negative for palpitations.  Gastrointestinal: Positive for nausea and vomiting. Negative for abdominal pain.  Genitourinary: Negative for dysuria.  Musculoskeletal: Positive for back pain (chronic unchanged). Negative for neck pain.  Skin: Negative for rash.  Neurological: Positive for light-headedness. Negative for syncope and numbness.    Allergies  Amoxicillin  Home Medications   Current Outpatient Rx  Name  Route  Sig  Dispense  Refill  . esomeprazole (NEXIUM) 40 MG capsule   Oral   Take 40 mg by mouth daily.          Marland Kitchen guaiFENesin (MUCINEX) 600 MG 12 hr tablet   Oral   Take 600 mg by mouth 2 (two) times daily as needed for cough.         . simvastatin (ZOCOR) 20 MG tablet   Oral   Take 20 mg by mouth at bedtime.          BP 126/71  Pulse 112  Temp(Src) 98.4 F (36.9 C) (Oral)  Resp 22  SpO2 100% Physical Exam  Nursing note and vitals reviewed. Constitutional: He appears well-developed and well-nourished.  HENT:  Head: Normocephalic and atraumatic.  Mouth/Throat: Mucous membranes are normal. Mucous membranes are not dry.  Eyes: Conjunctivae are normal.  Neck: Trachea normal and normal range of motion. Neck supple. Normal carotid pulses and no JVD present. No muscular tenderness present. Carotid bruit is not present. No tracheal deviation present.  Cardiovascular: Normal rate, regular rhythm, S1 normal, S2 normal, normal heart sounds and intact distal pulses.  Exam reveals no distant heart sounds and no decreased pulses.   No murmur heard. HR 85 during exam.   Pulmonary/Chest: Effort normal and breath sounds normal. No respiratory distress. He has no wheezes. He exhibits no tenderness.  Abdominal: Soft. Normal aorta and bowel sounds are normal. There is no tenderness. There is no rebound and no guarding.  Obese habitus  Musculoskeletal: He exhibits no edema.  Trace edema bilaterally to low ankles  Neurological: He is alert.   Skin: Skin is warm and dry. He is not diaphoretic. No cyanosis. No pallor.  Psychiatric: He has a normal mood and affect.    ED Course  Procedures (including critical care time) Labs Review Labs Reviewed  CBC WITH DIFFERENTIAL - Abnormal; Notable for the following:    Neutrophils Relative % 81 (*)    All other components within normal limits  COMPREHENSIVE METABOLIC PANEL  D-DIMER, QUANTITATIVE  CBC WITH DIFFERENTIAL  POCT I-STAT TROPONIN I   Imaging Review Dg Chest 2 View  06/29/2013   CLINICAL DATA:  Chest pain, shortness of breath  EXAM: CHEST  2 VIEW  COMPARISON:  10/04/2011  FINDINGS: Minor chronic bronchitic changes centrally without focal pneumonia, collapse or consolidation. No edema, effusion or pneumothorax. Trachea midline.  IMPRESSION: Stable exam.  No superimposed acute process   Electronically Signed   By: Ruel Favors M.D.   On: 06/29/2013 14:42    EKG Interpretation     Ventricular Rate:  101 PR Interval:  160 QRS Duration: 84 QT Interval:  334 QTC Calculation: 433 R Axis:   77 Text Interpretation:  Sinus tachycardia Cannot rule out Inferior infarct , age undetermined No significant change since last tracing           4:08 PM Patient seen and examined. Work-up initiated. Discussed results with Dr. Anitra Lauth. D-dimer added given not PERC neg and question of hemoptysis.   Vital signs reviewed and are as follows: Filed Vitals:   06/29/13 1406  BP: 126/71  Pulse: 112  Temp: 98.4 F (36.9 C)  Resp: 22   D-dimer neg. Pt informed. He seems relieved. Patient is encouraged to take his medications including Nexium regularly.  Patient was counseled to return with severe chest pain, especially if the pain is crushing or pressure-like and spreads to the arms, back, neck, or jaw, or if they have sweating, nausea, or shortness of breath with the pain. They were encouraged to call 911 with these symptoms.   They were also told to return if their chest pain  gets worse and does not go away with rest, they have an attack of chest pain lasting longer than usual despite rest and treatment with the medications their caregiver has prescribed, if they wake from sleep with chest pain or shortness of breath, if they feel dizzy or faint, if they have chest pain not typical of their usual pain, or if they have any other emergent concerns regarding their health.  The patient verbalized understanding and agreed.      MDM   1. Chest pain    Patient with chest pain x24 hours. Troponin negative. EKG unconcerning. Question of hemoptysis - chest x-ray is negative, D. dimer is negative. Feel this is likely related to GERD. Patient has been noncompliant with his medications and has history of hiatal hernia. Do not feel the pain represents ACS. Patient appears well and is improved at time  of discharge. He will follow up with his primary care physician for further evaluation.   Renne Crigler, PA-C 06/29/13 2026

## 2013-06-29 NOTE — ED Notes (Signed)
Patient C/O upper abdominal pain for 1 week.  States that he has a hiatal hernia and GERD. States that he began vomiting blood this AM with onset of chest pain. States that he vomited blood one time today and reports that it was a large amount.  Reports left chest pain that is non radiating.

## 2013-06-30 NOTE — ED Provider Notes (Signed)
Medical screening examination/treatment/procedure(s) were performed by non-physician practitioner and as supervising physician I was immediately available for consultation/collaboration.  EKG Interpretation     Ventricular Rate:  101 PR Interval:  160 QRS Duration: 84 QT Interval:  334 QTC Calculation: 433 R Axis:   77 Text Interpretation:  Sinus tachycardia Cannot rule out Inferior infarct , age undetermined No significant change since last tracing              Gwyneth Sprout, MD 06/30/13 1247

## 2013-08-08 ENCOUNTER — Emergency Department (HOSPITAL_COMMUNITY)
Admission: EM | Admit: 2013-08-08 | Discharge: 2013-08-08 | Disposition: A | Discharge status: Home / Self Care | Payer: Managed Care, Other (non HMO) | Source: Home / Self Care | Attending: Family Medicine | Admitting: Family Medicine

## 2013-08-08 ENCOUNTER — Encounter (HOSPITAL_COMMUNITY): Payer: Self-pay | Admitting: Emergency Medicine

## 2013-08-08 DIAGNOSIS — L03019 Cellulitis of unspecified finger: Secondary | ICD-10-CM

## 2013-08-08 DIAGNOSIS — IMO0001 Reserved for inherently not codable concepts without codable children: Secondary | ICD-10-CM

## 2013-08-08 NOTE — ED Provider Notes (Signed)
CSN: [id number]     Arrival date & time 08/08/13  9562 History   First MD Initiated Contact with Patient 08/08/13 1009     Chief Complaint  Patient presents with  . Hand Pain   (Consider location/radiation/quality/duration/timing/severity/associated sxs/prior Treatment) Patient is a 32 y.o. male presenting with hand pain. The history is provided by the patient.  Hand Pain This is a new problem. The current episode started more than 2 days ago. The problem has been gradually worsening.    Past Medical History  Diagnosis Date  . Obesity   . GERD (gastroesophageal reflux disease)   . Hypercholesterolemia   . Allergy   . Esophagitis    Past Surgical History  Procedure Laterality Date  . Knee arthroscopy    . Wisdom tooth extraction     Family History  Problem Relation Age of Onset  . Breast cancer Mother   . Diabetes    . Lymphoma    . Colon cancer Maternal Grandfather   . Esophageal cancer Neg Hx   . Stomach cancer Neg Hx    History  Substance Use Topics  . Smoking status: Never Smoker   . Smokeless tobacco: Never Used  . Alcohol Use: No    Review of Systems  Constitutional: Negative.   Musculoskeletal: Positive for joint swelling.  Skin: Positive for rash.    Allergies  Amoxicillin  Home Medications   Current Outpatient Rx  Name  Route  Sig  Dispense  Refill  . esomeprazole (NEXIUM) 40 MG capsule   Oral   Take 40 mg by mouth daily.          Marland Kitchen guaiFENesin (MUCINEX) 600 MG 12 hr tablet   Oral   Take 600 mg by mouth 2 (two) times daily as needed for cough.         . simvastatin (ZOCOR) 20 MG tablet   Oral   Take 20 mg by mouth at bedtime.          BP 115/80  Pulse 93  Temp(Src) 97.5 F (36.4 C) (Oral)  Resp 24  SpO2 97% Physical Exam  Nursing note and vitals reviewed. Constitutional: He is oriented to person, place, and time. He appears well-developed and well-nourished.  Musculoskeletal: He exhibits tenderness.  Neurological: He is  alert and oriented to person, place, and time.  Skin: Skin is warm and dry.  Tender fluctuant sts to cuticle area of rmf    ED Course  Drain paronychia Date/Time: 08/08/2013 10:36 AM Performed by: Linna Hoff Authorized by: Bradd Canary D Consent: Verbal consent obtained. Risks and benefits: risks, benefits and alternatives were discussed Consent given by: patient Preparation: Patient was prepped and draped in the usual sterile fashion. Local anesthesia used: yes Local anesthetic: topical anesthetic Patient sedated: no Patient tolerance: Patient tolerated the procedure well with no immediate complications. Comments: Purulent drainage, betadine soaked, dsd.   (including critical care time) Labs Review Labs Reviewed - No data to display Imaging Review No results found.  EKG Interpretation    Date/Time:    Ventricular Rate:    PR Interval:    QRS Duration:   QT Interval:    QTC Calculation:   R Axis:     Text Interpretation:              MDM   1. Paronychia of third finger, right       Linna Hoff, MD 08/08/13 1037

## 2013-08-08 NOTE — ED Notes (Signed)
Soaking finger in betadine as provided by physician

## 2013-08-08 NOTE — ED Notes (Signed)
At bedside for procedure 

## 2013-08-08 NOTE — ED Notes (Signed)
Right middle finger with pain, pus, and redness to right middle finger.  Noticed symptoms 3 days ago

## 2013-10-05 ENCOUNTER — Encounter: Payer: Self-pay | Admitting: Internal Medicine

## 2013-10-05 ENCOUNTER — Ambulatory Visit (INDEPENDENT_AMBULATORY_CARE_PROVIDER_SITE_OTHER): Payer: BC Managed Care – PPO | Admitting: Internal Medicine

## 2013-10-05 VITALS — BP 128/80 | HR 86 | Ht 67.5 in | Wt 315.0 lb

## 2013-10-05 DIAGNOSIS — K219 Gastro-esophageal reflux disease without esophagitis: Secondary | ICD-10-CM

## 2013-10-05 NOTE — Progress Notes (Signed)
HISTORY OF PRESENT ILLNESS:  [name] is a 33 y.o. male with hyperlipidemia, morbid obesity, and GERD with endoscopic documented esophagitis. Patient is sent by his primary care provider regarding ongoing problems with reflux and consideration of bariatric surgery. Patient has not been seen here in some time. For his reflux he have been prescribed Nexium 40 mg daily. He was using this proximally 4 times per week. Significant breakthrough symptoms. He also describes problems with gagging, regurgitation, and occasional vomiting. He has had minor hematemesis secondary to esophagitis previously. He has had difficulty with weight loss. He has multiple questions regarding reflux, his current symptoms, and bariatric surgery.  REVIEW OF SYSTEMS:  All non-GI ROS negative except for sinus and allergy, back pain, cough, fatigue, nosebleeds, sleeping problems, ankle swelling  Past Medical History  Diagnosis Date  . Obesity   . GERD (gastroesophageal reflux disease)   . Hypercholesterolemia   . Allergy   . Esophagitis     Past Surgical History  Procedure Laterality Date  . Knee arthroscopy    . Wisdom tooth extraction      Social History [name]  reports that he has never smoked. He has never used smokeless tobacco. He reports that he does not drink alcohol or use illicit drugs.  family history includes Breast cancer in his mother; Colon cancer in his maternal grandfather; Diabetes in an other family member; Lymphoma in an other family member. There is no history of Esophageal cancer or Stomach cancer.  Allergies  Allergen Reactions  . Amoxicillin Other (See Comments)    Internal burning and itching       PHYSICAL EXAMINATION: Vital signs: BP 128/80  Pulse 86  Ht 5' 7.5" (1.715 m)  Wt 315 lb (142.883 kg)  BMI 48.58 kg/m2 General: Well-developed, obese, well-nourished, no acute distress HEENT: Sclerae are anicteric, conjunctiva pink. Oral mucosa intact Lungs: Clear Heart:  Regular Abdomen: soft, obese, nontender, nondistended, no obvious ascites, no peritoneal signs, normal bowel sounds. No organomegaly. Extremities: No edema Psychiatric: alert and oriented x3. Cooperative   ASSESSMENT:  #1. GERD. Long discussion today on the pathophysiology of GERD. Importance of medication compliance and reflux precautions stressed. #2. Morbid obesity. Basic discussion regarding bariatric surgery.   PLAN:  #1. Reflux precautions with attention to weight loss. I stressed to him that most important thing is weight loss with a combination of exercise and diet. He could obtain professional help with a nutritionist as well as fitness specialist. Obviously, if successful, this would be preferred or surgery as a first option. #2. Nexium 40 mg twice a day. If symptoms controlled, titrated to once daily and see how he does. Lowest dose required to control symptoms desired #3. Given the phone number for [facility]. They have educational seminars on bariatric surgery. This would be most helpful for his education and answer the questions on the topic from the professionals that perform the procedure. Ongoing general medical care with Dr. Nehemiah SettlePolite #4. Long discussion today on the pathophysiology of GERD. In addition, literature on GERD provided for his review

## 2013-10-05 NOTE — Patient Instructions (Signed)
Information on [facility] is below   [facility] is located at [employer] [facility], [address].   Phone Number: [phone].

## 2014-06-18 ENCOUNTER — Emergency Department (HOSPITAL_COMMUNITY)
Admission: EM | Admit: 2014-06-18 | Discharge: 2014-06-18 | Disposition: A | Discharge status: Home / Self Care | Payer: BC Managed Care – PPO | Source: Home / Self Care | Attending: Family Medicine | Admitting: Family Medicine

## 2014-06-18 ENCOUNTER — Emergency Department (INDEPENDENT_AMBULATORY_CARE_PROVIDER_SITE_OTHER): Payer: BC Managed Care – PPO

## 2014-06-18 ENCOUNTER — Encounter (HOSPITAL_COMMUNITY): Payer: Self-pay | Admitting: Emergency Medicine

## 2014-06-18 DIAGNOSIS — S9031XA Contusion of right foot, initial encounter: Secondary | ICD-10-CM

## 2014-06-18 DIAGNOSIS — M79673 Pain in unspecified foot: Secondary | ICD-10-CM

## 2014-06-18 NOTE — Discharge Instructions (Signed)
There is no evidence of fracture in your foot You likely have a deep bruise of the bones of the mid foot. Please wear the cam walker boot daily for the next 1-2 weeks. Take your foot out 2-3 times a day for range of motion exercises When you stop using the boot, consider purchasing a neoprene foot sleeve for support Please remember to start on high dose ibuprofen for the next several days - ibuprofen 600-800mg  every 6-8 hours
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
## 2014-12-17 ENCOUNTER — Emergency Department (HOSPITAL_COMMUNITY)
Admission: EM | Admit: 2014-12-17 | Discharge: 2014-12-17 | Disposition: A | Discharge status: Home / Self Care | Payer: BLUE CROSS/BLUE SHIELD | Source: Home / Self Care | Attending: Family Medicine | Admitting: Family Medicine

## 2014-12-17 ENCOUNTER — Encounter (HOSPITAL_COMMUNITY): Payer: Self-pay | Admitting: Emergency Medicine

## 2014-12-17 ENCOUNTER — Emergency Department (INDEPENDENT_AMBULATORY_CARE_PROVIDER_SITE_OTHER): Payer: BLUE CROSS/BLUE SHIELD

## 2014-12-17 DIAGNOSIS — J209 Acute bronchitis, unspecified: Secondary | ICD-10-CM | POA: Diagnosis not present

## 2014-12-17 DIAGNOSIS — R062 Wheezing: Secondary | ICD-10-CM

## 2014-12-17 MED ORDER — METHYLPREDNISOLONE SODIUM SUCC 125 MG IJ SOLR
INTRAMUSCULAR | Status: AC
Start: 2014-12-17 — End: 2014-12-17
  Filled 2014-12-17: qty 2

## 2014-12-17 MED ORDER — PSEUDOEPH-BROMPHEN-DM 30-2-10 MG/5ML PO SYRP: 10.0000 mL | ORAL_SOLUTION | ORAL | Status: DC | PRN

## 2014-12-17 MED ORDER — PREDNISONE 10 MG PO TABS: ORAL_TABLET | ORAL | Status: DC

## 2014-12-17 MED ORDER — IPRATROPIUM BROMIDE 0.02 % IN SOLN
RESPIRATORY_TRACT | Status: AC
  Filled 2014-12-17: qty 2.5

## 2014-12-17 MED ORDER — ALBUTEROL SULFATE (2.5 MG/3ML) 0.083% IN NEBU
INHALATION_SOLUTION | RESPIRATORY_TRACT | Status: AC
  Filled 2014-12-17: qty 6

## 2014-12-17 MED ORDER — ONDANSETRON 4 MG PO TBDP
ORAL_TABLET | ORAL | Status: AC
  Filled 2014-12-17: qty 1

## 2014-12-17 MED ORDER — ONDANSETRON 4 MG PO TBDP
4.0000 mg | ORAL_TABLET | Freq: Once | ORAL | Status: AC
  Administered 2014-12-17: 4 mg via ORAL

## 2014-12-17 MED ORDER — ALBUTEROL SULFATE HFA 108 (90 BASE) MCG/ACT IN AERS: 2.0000 | INHALATION_SPRAY | RESPIRATORY_TRACT | Status: DC | PRN

## 2014-12-17 MED ORDER — DOXYCYCLINE HYCLATE 100 MG PO CAPS: 100.0000 mg | ORAL_CAPSULE | Freq: Two times a day (BID) | ORAL | Status: DC

## 2014-12-17 MED ORDER — METHYLPREDNISOLONE SODIUM SUCC 125 MG IJ SOLR
125.0000 mg | Freq: Once | INTRAMUSCULAR | Status: AC
  Administered 2014-12-17: 125 mg via INTRAMUSCULAR

## 2014-12-17 MED ORDER — ALBUTEROL SULFATE (2.5 MG/3ML) 0.083% IN NEBU
5.0000 mg | INHALATION_SOLUTION | Freq: Once | RESPIRATORY_TRACT | Status: AC
  Administered 2014-12-17: 5 mg via RESPIRATORY_TRACT

## 2014-12-17 MED ORDER — IPRATROPIUM BROMIDE 0.02 % IN SOLN
0.5000 mg | Freq: Once | RESPIRATORY_TRACT | Status: AC
  Administered 2014-12-17: 0.5 mg via RESPIRATORY_TRACT

## 2014-12-17 NOTE — ED Notes (Signed)
Patient c/o possible sinus infection or bronchitis x 3 days. Has a lot of sinus drainage and nausea. Patient reports he has been wheezing and has fever. Patient has a hx of asthma. Patient is in NAD.

## 2014-12-17 NOTE — ED Provider Notes (Signed)
CSN: [id number]     Arrival date & time 12/17/14  0814 History   First MD Initiated Contact with Patient 12/17/14 [phone]     Chief Complaint  Patient presents with  . URI   (Consider location/radiation/quality/duration/timing/severity/associated sxs/prior Treatment) HPI       34 year old male with a history of occasional reactive airways and asthma with upper respiratory infections presents complaining of fever, cough, headache, wheezing, nausea. His symptoms started about 3 days ago. He has been taking over-the-counter medications with minimal relief. 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 Denies any vomiting or diarrhea. No recent travel. He has sick contacts. He thinks she has a sinus infection.  Past Medical History  Diagnosis Date  . Obesity   . GERD (gastroesophageal reflux disease)   . Hypercholesterolemia   . Allergy   . Esophagitis    Past Surgical History  Procedure Laterality Date  . Knee arthroscopy    . Wisdom tooth extraction     Family History  Problem Relation Age of Onset  . Breast cancer Mother   . Diabetes    . Lymphoma    . Colon cancer Maternal Grandfather   . Esophageal cancer Neg Hx   . Stomach cancer Neg Hx    History  Substance Use Topics  . Smoking status: Never Smoker   . Smokeless tobacco: Never Used  . Alcohol Use: No    Review of Systems  Constitutional: Positive for fever and chills.  HENT: Positive for congestion, sinus pressure and sore throat. Negative for ear pain and rhinorrhea.   Respiratory: Positive for shortness of breath and wheezing. Negative for chest tightness.   Cardiovascular: Positive for chest pain.  Gastrointestinal: Positive for nausea. Negative for vomiting, abdominal pain and diarrhea.  Neurological: Positive for headaches.  All other systems reviewed and are negative.   Allergies  Amoxicillin  Home Medications   Prior to Admission medications    Medication Sig Start Date End Date Taking? Authorizing Provider  albuterol (PROVENTIL HFA;VENTOLIN HFA) 108 (90 BASE) MCG/ACT inhaler Inhale 2 puffs into the lungs every 4 (four) hours as needed for wheezing. 12/17/14   Graylon Good, PA-C  brompheniramine-pseudoephedrine-DM 30-2-10 MG/5ML syrup Take 10 mLs by mouth every 4 (four) hours as needed. 12/17/14   Graylon Good, PA-C  doxycycline (VIBRAMYCIN) 100 MG capsule Take 1 capsule (100 mg total) by mouth 2 (two) times daily. 12/17/14   Graylon Good, PA-C  esomeprazole (NEXIUM) 40 MG capsule Take 40 mg by mouth daily.     Historical Provider, MD  guaiFENesin (MUCINEX) 600 MG 12 hr tablet Take 600 mg by mouth 2 (two) times daily as needed for cough.    Historical Provider, MD  predniSONE (DELTASONE) 10 MG tablet 4 tabs PO QD for 4 days; 3 tabs PO QD for 3 days; 2 tabs PO QD for 2 days; 1 tab PO QD for 1 day 12/17/14   Graylon Good, PA-C  simvastatin (ZOCOR) 20 MG tablet Take 20 mg by mouth at bedtime.    Historical Provider, MD   BP 128/95 mmHg  Pulse 83  Temp(Src) 98.3 F (36.8 C) (Oral)  Resp 20  SpO2 98% Physical Exam  ED Course  Procedures (including critical care time) Labs Review Labs Reviewed - No data to display  Imaging Review Dg Chest 2 View  12/17/2014   CLINICAL DATA:  Cough, fever, wheezing, history of bronchitis  EXAM: CHEST  2 VIEW  COMPARISON:  06/29/2013  FINDINGS: Cardiomediastinal silhouette is stable. Suboptimal study due to patient's large body habitus. No acute infiltrate or pleural effusion. No pulmonary edema. Bony thorax is unremarkable.  IMPRESSION: No active cardiopulmonary disease.   Electronically Signed   By: Natasha MeadLiviu  Pop M.D.   On: 12/17/2014 10:01     MDM   1. Acute bronchitis, unspecified organism   2. Wheezing    Total symptomatic improvement and improvement auscultation with the breathing treatment. Chest x-ray is normal. He is afebrile and nontoxic. I suspect he has acute bronchitis. Treat  symptomatically. Antibiotic to start if he continues to worsen over the next 2 days, otherwise throw antibiotic prescription away.  Meds ordered this encounter  Medications  . methylPREDNISolone sodium succinate (SOLU-MEDROL) 125 mg/2 mL injection 125 mg    Sig:   . albuterol (PROVENTIL) (2.5 MG/3ML) 0.083% nebulizer solution 5 mg    Sig:   . ipratropium (ATROVENT) nebulizer solution 0.5 mg    Sig:   . ondansetron (ZOFRAN-ODT) disintegrating tablet 4 mg    Sig:   . albuterol (PROVENTIL HFA;VENTOLIN HFA) 108 (90 BASE) MCG/ACT inhaler    Sig: Inhale 2 puffs into the lungs every 4 (four) hours as needed for wheezing.    Dispense:  1 Inhaler    Refill:  2  . predniSONE (DELTASONE) 10 MG tablet    Sig: 4 tabs PO QD for 4 days; 3 tabs PO QD for 3 days; 2 tabs PO QD for 2 days; 1 tab PO QD for 1 day    Dispense:  30 tablet    Refill:  0  . brompheniramine-pseudoephedrine-DM 30-2-10 MG/5ML syrup    Sig: Take 10 mLs by mouth every 4 (four) hours as needed.    Dispense:  120 mL    Refill:  2  . doxycycline (VIBRAMYCIN) 100 MG capsule    Sig: Take 1 capsule (100 mg total) by mouth 2 (two) times daily.    Dispense:  14 capsule    Refill:  0       Graylon GoodZachary H Asaad Gulley, PA-C 12/17/14 1046

## 2014-12-17 NOTE — Discharge Instructions (Signed)
Only start the antibiotic if your sinus pain continues to worsen over a total of 10 days or if your fever and shortness of breath do not start to go away in a few more days.  Acute Bronchitis Bronchitis is inflammation of the airways that extend from the windpipe into the lungs (bronchi). The inflammation often causes mucus to develop. This leads to a cough, which is the most common symptom of bronchitis.  In acute bronchitis, the condition usually develops suddenly and goes away over time, usually in a couple weeks. Smoking, allergies, and asthma can make bronchitis worse. Repeated episodes of bronchitis may cause further lung problems.  CAUSES Acute bronchitis is most often caused by the same virus that causes a cold. The virus can spread from person to person (contagious) through coughing, sneezing, and touching contaminated objects. SIGNS AND SYMPTOMS   Cough.   Fever.   Coughing up mucus.   Body aches.   Chest congestion.   Chills.   Shortness of breath.   Sore throat.  DIAGNOSIS  Acute bronchitis is usually diagnosed through a physical exam. Your health care provider will also ask you questions about your medical history. Tests, such as chest X-rays, are sometimes done to rule out other conditions.  TREATMENT  Acute bronchitis usually goes away in a couple weeks. Oftentimes, no medical treatment is necessary. Medicines are sometimes given for relief of fever or cough. Antibiotic medicines are usually not needed but may be prescribed in certain situations. In some cases, an inhaler may be recommended to help reduce shortness of breath and control the cough. A cool mist vaporizer may also be used to help thin bronchial secretions and make it easier to clear the chest.  HOME CARE INSTRUCTIONS  Get plenty of rest.   Drink enough fluids to keep your urine clear or pale yellow (unless you have a medical condition that requires fluid restriction). Increasing fluids may help  thin your respiratory secretions (sputum) and reduce chest congestion, and it will prevent dehydration.   Take medicines only as directed by your health care provider.  If you were prescribed an antibiotic medicine, finish it all even if you start to feel better.  Avoid smoking and secondhand smoke. Exposure to cigarette smoke or irritating chemicals will make bronchitis worse. If you are a smoker, consider using nicotine gum or skin patches to help control withdrawal symptoms. Quitting smoking will help your lungs heal faster.   Reduce the chances of another bout of acute bronchitis by washing your hands frequently, avoiding people with cold symptoms, and trying not to touch your hands to your mouth, nose, or eyes.   Keep all follow-up visits as directed by your health care provider.  SEEK MEDICAL CARE IF: Your symptoms do not improve after 1 week of treatment.  SEEK IMMEDIATE MEDICAL CARE IF:  You develop an increased fever or chills.   You have chest pain.   You have severe shortness of breath.  You have bloody sputum.   You develop dehydration.  You faint or repeatedly feel like you are going to pass out.  You develop repeated vomiting.  You develop a severe headache. MAKE SURE YOU:   Understand these instructions.  Will watch your condition.  Will get help right away if you are not doing well or get worse. Document Released: 09/09/2004 Document Revised: 12/17/2013 Document Reviewed: 01/23/2013 [facility] Patient Information 2015 [place], [place]. This information is not intended to replace advice given to you by your health care provider.  Make sure you discuss any questions you have with your health care provider. ° °

## 2015-03-20 ENCOUNTER — Other Ambulatory Visit (HOSPITAL_COMMUNITY): Payer: Self-pay | Admitting: Respiratory Therapy

## 2015-03-20 DIAGNOSIS — R062 Wheezing: Secondary | ICD-10-CM

## 2015-03-25 ENCOUNTER — Ambulatory Visit (HOSPITAL_COMMUNITY)
Admission: RE | Admit: 2015-03-25 | Discharge: 2015-03-25 | Disposition: A | Discharge status: Home / Self Care | Payer: BLUE CROSS/BLUE SHIELD | Source: Ambulatory Visit | Attending: Internal Medicine | Admitting: Internal Medicine

## 2015-03-25 DIAGNOSIS — R062 Wheezing: Secondary | ICD-10-CM | POA: Diagnosis not present

## 2015-03-25 LAB — SPIROMETRY WITH GRAPH
DL/VA % pred: 152 %
DL/VA: 6.81 ml/min/mmHg/L
DLCO UNC: 27.99 ml/min/mmHg
DLCO unc % pred: 98 %
FEF 25-75 PRE: 3.98 L/s
FEF2575-%Pred-Pre: 109 %
FEV1-%PRED-PRE: 82 %
FEV1-Pre: 2.74 L
FEV1FVC-%Pred-Pre: 107 %
FEV6-%PRED-PRE: 77 %
FEV6-Pre: 3.06 L
FEV6FVC-%PRED-PRE: 102 %
FVC-%PRED-PRE: 76 %
FVC-Pre: 3.08 L
PRE FEV1/FVC RATIO: 89 %
PRE FEV6/FVC RATIO: 100 %

## 2015-05-08 ENCOUNTER — Encounter: Payer: Self-pay | Admitting: Internal Medicine

## 2015-12-10 MED FILL — CHERATUSSIN AC SYRUP: 100-10 | 5 days supply | Qty: 150 | Fill #0

## 2015-12-10 MED FILL — MOXIFLOXACIN HCL 400 MG TAB: 400 | 10 days supply | Qty: 10 | Fill #0

## 2016-03-16 ENCOUNTER — Ambulatory Visit (HOSPITAL_COMMUNITY)
Admission: RE | Admit: 2016-03-16 | Discharge: 2016-03-16 | Disposition: A | Discharge status: Home / Self Care | Payer: 59 | Source: Ambulatory Visit | Attending: Internal Medicine | Admitting: Internal Medicine

## 2016-03-16 ENCOUNTER — Other Ambulatory Visit (HOSPITAL_COMMUNITY): Payer: Self-pay | Admitting: Internal Medicine

## 2016-03-16 DIAGNOSIS — M79662 Pain in left lower leg: Secondary | ICD-10-CM

## 2016-03-16 DIAGNOSIS — M7989 Other specified soft tissue disorders: Secondary | ICD-10-CM | POA: Diagnosis not present

## 2016-03-16 DIAGNOSIS — M79605 Pain in left leg: Secondary | ICD-10-CM | POA: Insufficient documentation

## 2016-03-16 DIAGNOSIS — R0600 Dyspnea, unspecified: Secondary | ICD-10-CM | POA: Diagnosis not present

## 2016-03-16 NOTE — Progress Notes (Signed)
VASCULAR LAB PRELIMINARY  PRELIMINARY  PRELIMINARY  PRELIMINARY  Left lower extremity venous duplex has been completed.     Left:  No evidence of DVT, superficial thrombosis, or Baker's cyst.  Called Dr. Idelle Crouch office left message with results to Darlene, Rn @4 :50 pm.  Jenetta Loges, RVT, RDMS 03/16/2016, 4:50 PM

## 2016-07-14 MED FILL — levoFLOXacin 500 MG TABS: 500 | 7 days supply | Qty: 7 | Fill #0

## 2016-08-06 DIAGNOSIS — J069 Acute upper respiratory infection, unspecified: Secondary | ICD-10-CM | POA: Diagnosis not present

## 2016-08-06 DIAGNOSIS — J302 Other seasonal allergic rhinitis: Secondary | ICD-10-CM | POA: Diagnosis not present

## 2016-08-06 DIAGNOSIS — K219 Gastro-esophageal reflux disease without esophagitis: Secondary | ICD-10-CM | POA: Diagnosis not present

## 2016-08-06 MED FILL — HYDROCODONE-CHLORPHENIRAM S: 10-8 | 10 days supply | Qty: 100 | Fill #0

## 2016-08-06 MED FILL — levoFLOXacin 500 MG TABS: 500 | 5 days supply | Qty: 5 | Fill #0

## 2016-08-06 MED FILL — VENTOLIN HFA 90 MCG INHALER: 108 (90 BAS | 25 days supply | Qty: 18 | Fill #0

## 2016-08-06 MED FILL — BENZONATATE 100 MG CAPSULE: 100 | 10 days supply | Qty: 30 | Fill #0

## 2016-08-06 MED FILL — ESOMEPRAZOLE MAG DR 40 MG C: 40 | 30 days supply | Qty: 60 | Fill #0

## 2016-08-06 MED FILL — MOMETASONE FUROATE 50 MCG S: 50 | 30 days supply | Qty: 17 | Fill #0

## 2016-08-11 ENCOUNTER — Encounter (HOSPITAL_COMMUNITY): Payer: Self-pay | Admitting: Emergency Medicine

## 2016-08-11 ENCOUNTER — Ambulatory Visit (INDEPENDENT_AMBULATORY_CARE_PROVIDER_SITE_OTHER): Payer: 59

## 2016-08-11 ENCOUNTER — Ambulatory Visit (HOSPITAL_COMMUNITY)
Admission: EM | Admit: 2016-08-11 | Discharge: 2016-08-11 | Disposition: A | Discharge status: Home / Self Care | Payer: 59 | Attending: Family Medicine | Admitting: Family Medicine

## 2016-08-11 DIAGNOSIS — J0101 Acute recurrent maxillary sinusitis: Secondary | ICD-10-CM | POA: Diagnosis not present

## 2016-08-11 DIAGNOSIS — R062 Wheezing: Secondary | ICD-10-CM

## 2016-08-11 DIAGNOSIS — R079 Chest pain, unspecified: Secondary | ICD-10-CM | POA: Diagnosis not present

## 2016-08-11 MED ORDER — ALBUTEROL SULFATE HFA 108 (90 BASE) MCG/ACT IN AERS: 2.0000 | INHALATION_SPRAY | Freq: Four times a day (QID) | RESPIRATORY_TRACT | 2 refills | Status: DC | PRN

## 2016-08-11 MED ORDER — IPRATROPIUM-ALBUTEROL 0.5-2.5 (3) MG/3ML IN SOLN
RESPIRATORY_TRACT | Status: AC
  Filled 2016-08-11: qty 3

## 2016-08-11 MED ORDER — METHYLPREDNISOLONE SODIUM SUCC 125 MG IJ SOLR
INTRAMUSCULAR | Status: AC
  Filled 2016-08-11: qty 2

## 2016-08-11 MED ORDER — IPRATROPIUM-ALBUTEROL 0.5-2.5 (3) MG/3ML IN SOLN
3.0000 mL | Freq: Once | RESPIRATORY_TRACT | Status: AC
  Administered 2016-08-11: 3 mL via RESPIRATORY_TRACT

## 2016-08-11 MED ORDER — PREDNISONE 10 MG PO TABS: 20.0000 mg | ORAL_TABLET | Freq: Every day | ORAL | 0 refills | Status: AC

## 2016-08-11 MED ORDER — METHYLPREDNISOLONE SODIUM SUCC 125 MG IJ SOLR
125.0000 mg | Freq: Once | INTRAMUSCULAR | Status: AC
  Administered 2016-08-11: 125 mg via INTRAMUSCULAR

## 2016-08-11 MED ORDER — LEVOFLOXACIN 750 MG PO TABS: 750.0000 mg | ORAL_TABLET | Freq: Every day | ORAL | 0 refills | Status: AC

## 2016-08-11 MED FILL — predniSONE 10 MG TABS: 10 | 9 days supply | Qty: 18 | Fill #0

## 2016-08-11 MED FILL — levoFLOXacin 750 MG TABS: 750 | 5 days supply | Qty: 5 | Fill #0

## 2016-08-11 NOTE — Discharge Instructions (Signed)
take antibiotic and prednisone as prescribed. Take the albuterol inhaler as needed for Shortness of breath. You must follow with the primary care doctor in a week for reevaluation. Chest x-ray today is normal.

## 2016-08-11 NOTE — ED Provider Notes (Signed)
CSN: [id number]     Arrival date & time 08/11/16  1111 History   None    Chief Complaint  Patient presents with  . URI   (Consider location/radiation/quality/duration/timing/severity/associated sxs/prior Treatment) Patient is a well-appearing 35 year old male, presents today for possible sinus infection on and off for 1 month. Patient reports that he gets sinus infection all the time. Patient started getting sick right after Thanksgiving, saw PCP and was given Avelox for sinusitis, and reports that he got better for 4-5 with this Avelox and was then immediately sick again. He then saw his PCP again, and was given Avelox again. Patient reports that the second round of Avelox seems to help, but has not gotten rid of the symptoms completely. 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 Patient reports his symptoms to be body aches, coughing, congestion and sinus pain. He noticed to have new onset of wheezing and SOB x 1 week. Patient reports that he is allergic to amoxicillin. He also reports that he is immune to z-pack. Patient endorses fever at home with Max temp of 100.3.         Past Medical History:  Diagnosis Date  . Allergy   . Esophagitis   . GERD (gastroesophageal reflux disease)   . Hypercholesterolemia   . Obesity    Past Surgical History:  Procedure Laterality Date  . KNEE ARTHROSCOPY    . WISDOM TOOTH EXTRACTION     Family History  Problem Relation Age of Onset  . Breast cancer Mother   . Diabetes    . Lymphoma    . Colon cancer Maternal Grandfather   . Esophageal cancer Neg Hx   . Stomach cancer Neg Hx    Social History  Substance Use Topics  . Smoking status: Never Smoker  . Smokeless tobacco: Never Used  . Alcohol use No    Review of Systems  Constitutional: Positive for chills, fatigue and fever.  HENT: Positive for congestion, rhinorrhea, sinus pain, sinus pressure, sneezing and sore throat. Negative  for ear pain.   Respiratory: Positive for cough, shortness of breath and wheezing.   Cardiovascular: Negative for palpitations.       Chest hurts to breath in and out  Gastrointestinal: Negative for abdominal pain, nausea and vomiting.  Musculoskeletal: Negative for myalgias.  Skin: Negative for rash.  Neurological: Negative for dizziness and headaches.    Allergies  Amoxicillin  Home Medications   Prior to Admission medications   Medication Sig Start Date End Date Taking? Authorizing Provider  albuterol (PROVENTIL HFA;VENTOLIN HFA) 108 (90 Base) MCG/ACT inhaler Inhale 2 puffs into the lungs every 6 (six) hours as needed for wheezing or shortness of breath. 08/11/16   Lucia EstelleFeng Malin Cervini, NP  brompheniramine-pseudoephedrine-DM 30-2-10 MG/5ML syrup Take 10 mLs by mouth every 4 (four) hours as needed. Patient not taking: Reported on 08/11/2016 12/17/14   Graylon GoodZachary H Baker, PA-C  doxycycline (VIBRAMYCIN) 100 MG capsule Take 1 capsule (100 mg total) by mouth 2 (two) times daily. Patient not taking: Reported on 08/11/2016 12/17/14   Graylon GoodZachary H Baker, PA-C  esomeprazole (NEXIUM) 40 MG capsule Take 40 mg by mouth daily.     Historical Provider, MD  guaiFENesin (MUCINEX) 600 MG 12 hr tablet Take 600 mg by mouth 2 (two) times daily as needed for cough.    Historical Provider, MD  levofloxacin (LEVAQUIN) 750 MG tablet Take 1 tablet (750 mg total) by mouth daily. 08/11/16 08/16/16  Lucia Estelle, NP  predniSONE (DELTASONE) 10 MG tablet Take 2 tablets (20 mg total) by mouth daily. Take 3 tablets from day 1-3, take 2 tablets from day 4-6, take 1 tablet from day 7-9 08/11/16 08/20/16  Lucia Estelle, NP  simvastatin (ZOCOR) 20 MG tablet Take 20 mg by mouth at bedtime.    Historical Provider, MD   Meds Ordered and Administered this Visit   Medications  ipratropium-albuterol (DUONEB) 0.5-2.5 (3) MG/3ML nebulizer solution 3 mL (3 mLs Nebulization Given 08/11/16 1254)  ipratropium-albuterol (DUONEB) 0.5-2.5 (3) MG/3ML nebulizer  solution 3 mL (3 mLs Nebulization Given 08/11/16 1324)  methylPREDNISolone sodium succinate (SOLU-MEDROL) 125 mg/2 mL injection 125 mg (125 mg Intramuscular Given 08/11/16 1330)    BP 111/76 (BP Location: Right Arm)   Pulse 95   Temp 97.9 F (36.6 C) (Oral)   Resp 24   SpO2 97%  No data found.   Physical Exam  Constitutional: He is oriented to person, place, and time. He appears well-developed and well-nourished. No distress.  HENT:  Head: Normocephalic and atraumatic.  Right Ear: External ear normal.  Left Ear: External ear normal.  Nose: Nose normal.  Mouth/Throat: Oropharynx is clear and moist. No oropharyngeal exudate.  TM pearly gray bilaterally with no erythema. Patient does have maxillary sinus pain on percussion  Eyes: Conjunctivae and EOM are normal. Pupils are equal, round, and reactive to light. Right eye exhibits no discharge. Left eye exhibits no discharge.  Neck: Normal range of motion. Neck supple.  Cardiovascular: Normal rate, regular rhythm and normal heart sounds.   No murmur heard. Pulmonary/Chest: Effort normal. No respiratory distress. He has wheezes.  Wheezes noted in left and right upper lobes  Abdominal: Soft. Bowel sounds are normal. He exhibits no distension. There is no tenderness.  Lymphadenopathy:    He has no cervical adenopathy.  Neurological: He is alert and oriented to person, place, and time.  Skin: Skin is warm and dry. He is not diaphoretic.  Nursing note and vitals reviewed.   Urgent Care Course   Clinical Course     Procedures (including critical care time)  Labs Review Labs Reviewed - No data to display  Imaging Review Dg Chest 2 View  Result Date: 08/11/2016 CLINICAL DATA:  Chest pain. EXAM: CHEST  2 VIEW COMPARISON:  Radiographs of Dec 17, 2014. FINDINGS: The heart size and mediastinal contours are within normal limits. Both lungs are clear. No pneumothorax or pleural effusion is noted. The visualized skeletal structures are  unremarkable. IMPRESSION: No active cardiopulmonary disease. Electronically Signed   By: Lupita Raider, M.D.   On: 08/11/2016 14:02     12:53: Wheezing noted on exam. Patient believes a nebulizer treatment will help. DuoNeb ordered.   13:10: DuoNeb finished. Wheezing not improve. Patient reports that he is not any better. Will repeat Duoneb x 1.   13:40: Second Duoneb finished; Wheezing still present and is now more prominent. Patient reports feeling the same. Pending for chest xray  MDM   1. Acute recurrent maxillary sinusitis   2. Wheezing    Patient is a well-appearing, no-distress 35 y.o. Male with cold symptoms on and off for 1 month. Differential includes recurrent sinusitis or recurrent URI. Wheezing noted on exam. DuoNeb treatment x 2 given today with no improvement noted in his wheezing. Patient is stable and appears well with no distress. He is safe to be discharged home. Will start patient on Levaquin daily x 5 days, 9-day prednisone taper, and albuterol inhaler  to use PRN. ER return precaution discussed. Patient informed to must f/u with PCP next week for re-evaluation.     Lucia EstelleFeng Elizeo Rodriques, NP 08/11/16 1426

## 2016-08-11 NOTE — ED Notes (Signed)
Patient receiving breathing tx, CXR to follow Breathing TX

## 2016-08-11 NOTE — ED Triage Notes (Signed)
Patient has had symptoms for 4 weeks.  Patient  Has been on avelox x 2  And seems to improve, but never goes away completely.  Patient is concerned for sinus infection and bronchitis.  Thinks he may need steroids .  pcp is out of office this week.  Patient feels pain in chest with breathing in, bad cough.

## 2016-08-20 ENCOUNTER — Other Ambulatory Visit (HOSPITAL_COMMUNITY): Payer: Self-pay | Admitting: General Surgery

## 2016-08-20 DIAGNOSIS — Z9989 Dependence on other enabling machines and devices: Secondary | ICD-10-CM | POA: Diagnosis not present

## 2016-08-20 DIAGNOSIS — G4733 Obstructive sleep apnea (adult) (pediatric): Secondary | ICD-10-CM | POA: Diagnosis not present

## 2016-08-20 DIAGNOSIS — Z6841 Body Mass Index (BMI) 40.0 and over, adult: Secondary | ICD-10-CM | POA: Diagnosis not present

## 2016-08-20 DIAGNOSIS — M5441 Lumbago with sciatica, right side: Secondary | ICD-10-CM | POA: Diagnosis not present

## 2016-08-20 DIAGNOSIS — K219 Gastro-esophageal reflux disease without esophagitis: Secondary | ICD-10-CM | POA: Diagnosis not present

## 2016-08-20 DIAGNOSIS — E785 Hyperlipidemia, unspecified: Secondary | ICD-10-CM | POA: Diagnosis not present

## 2016-08-20 DIAGNOSIS — G8929 Other chronic pain: Secondary | ICD-10-CM | POA: Diagnosis not present

## 2016-09-07 DIAGNOSIS — J208 Acute bronchitis due to other specified organisms: Secondary | ICD-10-CM | POA: Diagnosis not present

## 2016-09-07 DIAGNOSIS — R509 Fever, unspecified: Secondary | ICD-10-CM | POA: Diagnosis not present

## 2016-09-07 MED FILL — predniSONE 10 MG TABS: 10 | 6 days supply | Qty: 21 | Fill #0

## 2016-09-08 ENCOUNTER — Ambulatory Visit: Payer: 59 | Admitting: Dietician

## 2016-09-30 ENCOUNTER — Ambulatory Visit: Payer: 59 | Admitting: Psychiatry

## 2016-10-20 DIAGNOSIS — G4733 Obstructive sleep apnea (adult) (pediatric): Secondary | ICD-10-CM | POA: Diagnosis not present

## 2016-10-28 ENCOUNTER — Ambulatory Visit (INDEPENDENT_AMBULATORY_CARE_PROVIDER_SITE_OTHER): Payer: 59 | Admitting: Psychiatry

## 2016-10-28 DIAGNOSIS — F509 Eating disorder, unspecified: Secondary | ICD-10-CM

## 2016-11-04 ENCOUNTER — Other Ambulatory Visit (HOSPITAL_COMMUNITY): Payer: Self-pay | Admitting: General Surgery

## 2016-11-04 DIAGNOSIS — E669 Obesity, unspecified: Secondary | ICD-10-CM

## 2016-11-05 ENCOUNTER — Emergency Department (HOSPITAL_COMMUNITY): Payer: 59

## 2016-11-05 ENCOUNTER — Encounter (HOSPITAL_COMMUNITY): Payer: Self-pay

## 2016-11-05 ENCOUNTER — Emergency Department (HOSPITAL_COMMUNITY)
Admission: EM | Admit: 2016-11-05 | Discharge: 2016-11-06 | Disposition: A | Discharge status: Home / Self Care | Payer: 59 | Attending: Emergency Medicine | Admitting: Emergency Medicine

## 2016-11-05 ENCOUNTER — Ambulatory Visit (HOSPITAL_COMMUNITY)
Admission: EM | Admit: 2016-11-05 | Discharge: 2016-11-05 | Disposition: A | Discharge status: Home / Self Care | Payer: 59 | Attending: Family Medicine | Admitting: Family Medicine

## 2016-11-05 DIAGNOSIS — R1011 Right upper quadrant pain: Secondary | ICD-10-CM | POA: Insufficient documentation

## 2016-11-05 DIAGNOSIS — Z79899 Other long term (current) drug therapy: Secondary | ICD-10-CM | POA: Insufficient documentation

## 2016-11-05 DIAGNOSIS — R0781 Pleurodynia: Secondary | ICD-10-CM

## 2016-11-05 DIAGNOSIS — R319 Hematuria, unspecified: Secondary | ICD-10-CM | POA: Diagnosis not present

## 2016-11-05 DIAGNOSIS — Z1389 Encounter for screening for other disorder: Secondary | ICD-10-CM

## 2016-11-05 DIAGNOSIS — R109 Unspecified abdominal pain: Secondary | ICD-10-CM

## 2016-11-05 DIAGNOSIS — R1010 Upper abdominal pain, unspecified: Secondary | ICD-10-CM

## 2016-11-05 LAB — URINALYSIS, ROUTINE W REFLEX MICROSCOPIC
BILIRUBIN URINE: NEGATIVE
Bacteria, UA: NONE SEEN
Glucose, UA: NEGATIVE mg/dL
Ketones, ur: NEGATIVE mg/dL
LEUKOCYTES UA: NEGATIVE
Nitrite: NEGATIVE
Protein, ur: NEGATIVE mg/dL
SPECIFIC GRAVITY, URINE: 1.027 (ref 1.005–1.030)
WBC, UA: NONE SEEN WBC/hpf (ref 0–5)
pH: 5 (ref 5.0–8.0)

## 2016-11-05 LAB — POCT URINALYSIS DIP (DEVICE)
Bilirubin Urine: NEGATIVE
Glucose, UA: NEGATIVE mg/dL
Ketones, ur: NEGATIVE mg/dL
Leukocytes, UA: NEGATIVE
Nitrite: NEGATIVE
PH: 5.5 (ref 5.0–8.0)
PROTEIN: NEGATIVE mg/dL
UROBILINOGEN UA: 0.2 mg/dL (ref 0.0–1.0)

## 2016-11-05 LAB — BASIC METABOLIC PANEL
ANION GAP: 10 (ref 5–15)
BUN: 11 mg/dL (ref 6–20)
CO2: 30 mmol/L (ref 22–32)
Calcium: 9.6 mg/dL (ref 8.9–10.3)
Chloride: 100 mmol/L — ABNORMAL LOW (ref 101–111)
Creatinine, Ser: 0.94 mg/dL (ref 0.61–1.24)
GFR calc Af Amer: >60 mL/min (ref >60)
GLUCOSE: 86 mg/dL (ref 65–99)
Potassium: 3.9 mmol/L (ref 3.5–5.1)
SODIUM: 140 mmol/L (ref 135–145)

## 2016-11-05 LAB — CBC
HCT: 41.3 % (ref 39.0–52.0)
Hemoglobin: 13.4 g/dL (ref 13.0–17.0)
MCH: 27.1 pg (ref 26.0–34.0)
MCHC: 32.4 g/dL (ref 30.0–36.0)
MCV: 83.4 fL (ref 78.0–100.0)
PLATELETS: 281 10*3/uL (ref 150–400)
RBC: 4.95 MIL/uL (ref 4.22–5.81)
RDW: 13.3 % (ref 11.5–15.5)
WBC: 8.4 10*3/uL (ref 4.0–10.5)

## 2016-11-05 MED ORDER — IBUPROFEN 800 MG PO TABS
800.0000 mg | ORAL_TABLET | Freq: Three times a day (TID) | ORAL | 0 refills | Status: DC
Start: 2016-11-05 — End: 2017-01-11

## 2016-11-05 MED ORDER — KETOROLAC TROMETHAMINE 60 MG/2ML IM SOLN
60.0000 mg | Freq: Once | INTRAMUSCULAR | Status: AC
  Administered 2016-11-05: 60 mg via INTRAMUSCULAR

## 2016-11-05 MED ORDER — OXYCODONE-ACETAMINOPHEN 5-325 MG PO TABS
2.0000 | ORAL_TABLET | Freq: Once | ORAL | Status: AC
  Administered 2016-11-05: 2 via ORAL
  Filled 2016-11-05: qty 2

## 2016-11-05 MED ORDER — KETOROLAC TROMETHAMINE 60 MG/2ML IM SOLN
INTRAMUSCULAR | Status: AC
  Filled 2016-11-05: qty 2

## 2016-11-05 MED ORDER — HYDROCODONE-ACETAMINOPHEN 5-325 MG PO TABS: 2.0000 | ORAL_TABLET | ORAL | 0 refills | Status: DC | PRN

## 2016-11-05 NOTE — ED Triage Notes (Signed)
Right flank pain x 3 days. [facility] Pt had blood in urine and is concerned for kidney stones.  No urinary symptoms

## 2016-11-05 NOTE — ED Provider Notes (Signed)
[facility]-EMERGENCY DEPT Provider Note   CSN: [id number] Arrival date & time: 11/05/16  2006  By signing my name below, I, Octavia Heir, attest that this documentation has been prepared under the direction and in the presence of Azalia Bilis, MD.  Electronically Signed: Octavia Heir, ED Scribe. 11/05/16. 11:51 PM.    History   Chief Complaint Chief Complaint  Patient presents with  . Flank Pain    The history is provided by the patient. No language interpreter was used.   HPI Comments: [name] is a 36 y.o. male who has a PMhx of hypercholesterolemia and GERD presents to the Emergency Department complaining of intermittent, moderate, RUQ abdominal pain that radiates to his right flank x 4 days. Pt expresses that his pain has been constant for the past 24 hours. He has associated pain with inhalation, nausea, shortness of breath, and cough. He was seen earlier at [facility] for the same abdominal pain where he had a CT renal performed which was unremarkable. Pt states his pain is not post-prandial. Pt has no hx of gallstones. Pt does not have any abdominal surgical hx. He denies vomiting.  Past Medical History:  Diagnosis Date  . Allergy   . Esophagitis   . GERD (gastroesophageal reflux disease)   . Hypercholesterolemia   . Obesity     Patient Active Problem List   Diagnosis Date Noted  . Hypercholesterolemia   . GASTRITIS 12/18/2008  . GERD 12/09/2008  . NAUSEA WITH VOMITING 12/09/2008    Past Surgical History:  Procedure Laterality Date  . KNEE ARTHROSCOPY    . WISDOM TOOTH EXTRACTION         Home Medications    Prior to Admission medications   Medication Sig Start Date End Date Taking? Authorizing Provider  albuterol (PROVENTIL HFA;VENTOLIN HFA) 108 (90 Base) MCG/ACT inhaler Inhale 2 puffs into the lungs every 6 (six) hours as needed for wheezing or shortness of breath. 08/11/16   Lucia Estelle, NP  brompheniramine-pseudoephedrine-DM 30-2-10 MG/5ML syrup Take 10 mLs by  mouth every 4 (four) hours as needed. Patient not taking: Reported on 08/11/2016 12/17/14   Graylon Good, PA-C  doxycycline (VIBRAMYCIN) 100 MG capsule Take 1 capsule (100 mg total) by mouth 2 (two) times daily. Patient not taking: Reported on 08/11/2016 12/17/14   Graylon Good, PA-C  esomeprazole (NEXIUM) 40 MG capsule Take 40 mg by mouth daily.     Historical Provider, MD  guaiFENesin (MUCINEX) 600 MG 12 hr tablet Take 600 mg by mouth 2 (two) times daily as needed for cough.    Historical Provider, MD  HYDROcodone-acetaminophen (NORCO/VICODIN) 5-325 MG tablet Take 2 tablets by mouth every 4 (four) hours as needed. 11/05/16   Deatra Canter, FNP  ibuprofen (ADVIL,MOTRIN) 800 MG tablet Take 1 tablet (800 mg total) by mouth 3 (three) times daily. 11/05/16   Deatra Canter, FNP  simvastatin (ZOCOR) 20 MG tablet Take 20 mg by mouth at bedtime.    Historical Provider, MD    Family History Family History  Problem Relation Age of Onset  . Breast cancer Mother   . Diabetes    . Lymphoma    . Colon cancer Maternal Grandfather   . Esophageal cancer Neg Hx   . Stomach cancer Neg Hx     Social History Social History  Substance Use Topics  . Smoking status: Never Smoker  . Smokeless tobacco: Never Used  . Alcohol use No     Allergies   Amoxicillin  Review of Systems Review of Systems  A complete 10 system review of systems was obtained and all systems are negative except as noted in the HPI and PMH.    Physical Exam Updated Vital Signs BP 130/77 (BP Location: Left Arm)   Pulse 86   Temp 98.3 F (36.8 C) (Oral)   Resp 19   Ht 5\' 8"  (1.727 m)   Wt (!) 347 lb 1 oz (157.4 kg)   SpO2 98%   BMI 52.77 kg/m   Physical Exam  Constitutional: He is oriented to person, place, and time. He appears well-developed and well-nourished.  HENT:  Head: Normocephalic and atraumatic.  obese  Eyes: EOM are normal.  Neck: Normal range of motion.  Cardiovascular: Normal rate, regular  rhythm, normal heart sounds and intact distal pulses.   Pulmonary/Chest: Effort normal and breath sounds normal. No respiratory distress.  Abdominal: Soft. He exhibits no distension. There is tenderness.  RUQ tenderness  Musculoskeletal: Normal range of motion.  Neurological: He is alert and oriented to person, place, and time.  Skin: Skin is warm and dry.  Psychiatric: He has a normal mood and affect. Judgment normal.  Nursing note and vitals reviewed.    ED Treatments / Results  DIAGNOSTIC STUDIES: Oxygen Saturation is 98% on RA, normal by my interpretation.  COORDINATION OF CARE:  11:47 PM Discussed treatment plan with pt at bedside and pt agreed to plan.  Labs (all labs ordered are listed, but only abnormal results are displayed) Labs Reviewed  URINALYSIS, ROUTINE W REFLEX MICROSCOPIC - Abnormal; Notable for the following:       Result Value   Hgb urine dipstick MODERATE (*)    Squamous Epithelial / LPF 0-5 (*)    All other components within normal limits  BASIC METABOLIC PANEL - Abnormal; Notable for the following:    Chloride 100 (*)    All other components within normal limits  CBC    EKG  EKG Interpretation None       Radiology Dg Chest 2 View  Result Date: 11/05/2016 CLINICAL DATA:  Acute onset of right flank pain and hematuria. Initial encounter. EXAM: CHEST  2 VIEW COMPARISON:  Chest radiograph performed 08/11/2016 FINDINGS: The lungs are well-aerated. Mild vascular congestion is noted. There is no evidence of focal opacification, pleural effusion or pneumothorax. The heart is normal in size; the mediastinal contour is within normal limits. No acute osseous abnormalities are seen. IMPRESSION: Mild vascular congestion noted.  Lungs remain grossly clear. Electronically Signed   By: Roanna RaiderJeffery  Chang M.D.   On: 11/05/2016 22:56   Ct Renal Stone Study  Result Date: 11/05/2016 CLINICAL DATA:  36 year old male with right-sided flank pain and hematuria. EXAM: CT  ABDOMEN AND PELVIS WITHOUT CONTRAST TECHNIQUE: Multidetector CT imaging of the abdomen and pelvis was performed following the standard protocol without IV contrast. COMPARISON:  None. FINDINGS: Evaluation of this exam is limited in the absence of intravenous contrast. Lower chest: The visualized lung bases are clear. No intra-abdominal free air or free fluid. Hepatobiliary: Mild fatty infiltration of the liver. No intrahepatic biliary ductal dilatation. The gallbladder is unremarkable. Pancreas: Unremarkable. No pancreatic ductal dilatation or surrounding inflammatory changes. Spleen: Normal in size without focal abnormality. Adrenals/Urinary Tract: The adrenal glands are unremarkable. The kidneys, visualized ureters appear unremarkable as well. The urinary bladder is collapsed. High attenuating content within the base of the bladder may be partially related to volume averaging of the bladder wall or represent small amounts of blood product.  Correlation with clinical exam and urinalysis recommended. No definite calcified stone identified. Stomach/Bowel: Stomach is within normal limits. Appendix appears normal. No evidence of bowel wall thickening, distention, or inflammatory changes. Vascular/Lymphatic: No significant vascular findings are present. No enlarged abdominal or pelvic lymph nodes. Reproductive: The prostate and seminal vesicles are grossly unremarkable. Other: Mild stranding of the subcutaneous fat in the anterior lower abdomen. No fluid collection. Musculoskeletal: No acute or significant osseous findings. IMPRESSION: 1. No hydronephrosis or nephrolithiasis. Small high attenuating appearing content within the base of the bladder may represent small amount of blood product. Correlation with clinical exam and urinalysis recommended. 2. No bowel obstruction or active inflammation.  Normal appendix. Electronically Signed   By: Elgie Collard M.D.   On: 11/05/2016 22:45    Procedures Procedures  (including critical care time)  Medications Ordered in ED Medications - No data to display   Initial Impression / Assessment and Plan / ED Course  I have reviewed the triage vital signs and the nursing notes.  Pertinent labs & imaging results that were available during my care of the patient were reviewed by me and considered in my medical decision making (see chart for details).     1:38 AM Patient is overall well-appearing.  CT scan as well as focused right upper quadrant ultrasound or Mr. no significant abnormality.  Discharge home in good condition.  LFTs and lipase are normal.  No signs of urinary tract infection.  No clear etiology found for his upper abdominal discomfort.  Close PCP follow-up.  Patient understands return to the ER for new or worsening symptoms  Final Clinical Impressions(s) / ED Diagnoses   Final diagnoses:  Upper abdominal pain  Right upper quadrant abdominal pain   I personally performed the services described in this documentation, which was scribed in my presence. The recorded information has been reviewed and is accurate.     New Prescriptions New Prescriptions   ONDANSETRON (ZOFRAN ODT) 8 MG DISINTEGRATING TABLET    Take 1 tablet (8 mg total) by mouth every 8 (eight) hours as needed for nausea or vomiting.     Azalia Bilis, MD 11/06/16 [phone]

## 2016-11-05 NOTE — ED Provider Notes (Signed)
CSN: [id number]     Arrival date & time 11/05/16  1806 History   None    Chief Complaint  Patient presents with  . Flank Pain   (Consider location/radiation/quality/duration/timing/severity/associated sxs/prior Treatment) Patient c/o left sided rib pain and back pain for 2 days.   The history is provided by the patient.  Flank Pain  This is a new problem. The problem has not changed since onset.   Past Medical History:  Diagnosis Date  . Allergy   . Esophagitis   . GERD (gastroesophageal reflux disease)   . Hypercholesterolemia   . Obesity    Past Surgical History:  Procedure Laterality Date  . KNEE ARTHROSCOPY    . WISDOM TOOTH EXTRACTION     Family History  Problem Relation Age of Onset  . Breast cancer Mother   . Diabetes    . Lymphoma    . Colon cancer Maternal Grandfather   . Esophageal cancer Neg Hx   . Stomach cancer Neg Hx    Social History  Substance Use Topics  . Smoking status: Never Smoker  . Smokeless tobacco: Never Used  . Alcohol use No    Review of Systems  Constitutional: Negative.   HENT: Negative.   Eyes: Negative.   Respiratory: Negative.   Cardiovascular: Negative.   Gastrointestinal: Negative.   Endocrine: Negative.   Genitourinary: Positive for flank pain.  Musculoskeletal: Positive for arthralgias.  Skin: Negative.   Allergic/Immunologic: Negative.   Neurological: Negative.  Negative for dizziness.  Hematological: Negative.   Psychiatric/Behavioral: Negative.     Allergies  Amoxicillin  Home Medications   Prior to Admission medications   Medication Sig Start Date End Date Taking? Authorizing Provider  albuterol (PROVENTIL HFA;VENTOLIN HFA) 108 (90 Base) MCG/ACT inhaler Inhale 2 puffs into the lungs every 6 (six) hours as needed for wheezing or shortness of breath. 08/11/16   Lucia Estelle, NP  brompheniramine-pseudoephedrine-DM 30-2-10 MG/5ML syrup Take 10 mLs by mouth every 4 (four) hours as needed. Patient not taking:  Reported on 08/11/2016 12/17/14   Graylon Good, PA-C  doxycycline (VIBRAMYCIN) 100 MG capsule Take 1 capsule (100 mg total) by mouth 2 (two) times daily. Patient not taking: Reported on 08/11/2016 12/17/14   Graylon Good, PA-C  esomeprazole (NEXIUM) 40 MG capsule Take 40 mg by mouth daily.     Historical Provider, MD  guaiFENesin (MUCINEX) 600 MG 12 hr tablet Take 600 mg by mouth 2 (two) times daily as needed for cough.    Historical Provider, MD  HYDROcodone-acetaminophen (NORCO/VICODIN) 5-325 MG tablet Take 2 tablets by mouth every 4 (four) hours as needed. 11/05/16   Deatra Canter, FNP  ibuprofen (ADVIL,MOTRIN) 800 MG tablet Take 1 tablet (800 mg total) by mouth 3 (three) times daily. 11/05/16   Deatra Canter, FNP  simvastatin (ZOCOR) 20 MG tablet Take 20 mg by mouth at bedtime.    Historical Provider, MD   Meds Ordered and Administered this Visit   Medications  ketorolac (TORADOL) injection 60 mg (60 mg Intramuscular Given 11/05/16 1944)    BP 124/67   Pulse 89   Temp 98.5 F (36.9 C) (Oral)   Resp 20   SpO2 99%  No data found.   Physical Exam  Constitutional: He appears well-developed and well-nourished.  HENT:  Head: Normocephalic and atraumatic.  Eyes: Conjunctivae and EOM are normal. Pupils are equal, round, and reactive to light.  Neck: Normal range of motion. Neck supple.  Cardiovascular: Normal rate, regular rhythm  and normal heart sounds.   Pulmonary/Chest: Effort normal and breath sounds normal.  Abdominal: Soft. Bowel sounds are normal.  Musculoskeletal: He exhibits tenderness.  TTP left scapular region and TTP left anterior lateral chest wall.  Nursing note and vitals reviewed.   Urgent Care Course     Procedures (including critical care time)  Labs Review Labs Reviewed  POCT URINALYSIS DIP (DEVICE) - Abnormal; Notable for the following:       Result Value   Hgb urine dipstick SMALL (*)    All other components within normal limits    Imaging  Review No results found.   Visual Acuity Review  Right Eye Distance:   Left Eye Distance:   Bilateral Distance:    Right Eye Near:   Left Eye Near:    Bilateral Near:         MDM   1. Left flank pain   2. Costal margin pain    Naprosyn 500mg  one po bid x 7 days #14 Flexeril 10 mg one po bid prn #20      Deatra CanterWilliam J Ramone Gander, FNP 11/05/16 1958

## 2016-11-05 NOTE — ED Triage Notes (Signed)
Pt having right side pain for 2 days, hurts when he breathes and walk and did take some old tramadol he had but did not help.

## 2016-11-06 ENCOUNTER — Emergency Department (HOSPITAL_COMMUNITY): Payer: 59

## 2016-11-06 DIAGNOSIS — R1010 Upper abdominal pain, unspecified: Secondary | ICD-10-CM | POA: Diagnosis not present

## 2016-11-06 DIAGNOSIS — R1011 Right upper quadrant pain: Secondary | ICD-10-CM | POA: Diagnosis not present

## 2016-11-06 DIAGNOSIS — Z79899 Other long term (current) drug therapy: Secondary | ICD-10-CM | POA: Diagnosis not present

## 2016-11-06 LAB — HEPATIC FUNCTION PANEL
ALBUMIN: 3.4 g/dL — AB (ref 3.5–5.0)
ALK PHOS: 90 U/L (ref 38–126)
ALT: 30 U/L (ref 17–63)
AST: 22 U/L (ref 15–41)
Bilirubin, Direct: <0.1 mg/dL — ABNORMAL LOW (ref 0.1–0.5)
Total Bilirubin: 0.5 mg/dL (ref 0.3–1.2)
Total Protein: 7.9 g/dL (ref 6.5–8.1)

## 2016-11-06 LAB — LIPASE, BLOOD: LIPASE: 16 U/L (ref 11–51)

## 2016-11-06 MED ORDER — ONDANSETRON 8 MG PO TBDP: 8.0000 mg | ORAL_TABLET | Freq: Three times a day (TID) | ORAL | 0 refills | Status: DC | PRN

## 2016-11-06 NOTE — ED Notes (Signed)
Pt stable, ambulatory, states understanding of discharge instructions 

## 2016-11-06 NOTE — ED Notes (Signed)
Patient transported to Ultrasound 

## 2016-11-08 ENCOUNTER — Ambulatory Visit (HOSPITAL_COMMUNITY)
Admission: RE | Admit: 2016-11-08 | Discharge: 2016-11-08 | Disposition: A | Discharge status: Home / Self Care | Payer: 59 | Source: Ambulatory Visit | Attending: General Surgery | Admitting: General Surgery

## 2016-11-08 DIAGNOSIS — R000 Tachycardia, unspecified: Secondary | ICD-10-CM | POA: Insufficient documentation

## 2016-11-08 DIAGNOSIS — R9431 Abnormal electrocardiogram [ECG] [EKG]: Secondary | ICD-10-CM | POA: Insufficient documentation

## 2016-11-08 DIAGNOSIS — I517 Cardiomegaly: Secondary | ICD-10-CM | POA: Diagnosis not present

## 2016-11-23 ENCOUNTER — Ambulatory Visit (HOSPITAL_COMMUNITY)
Admission: RE | Admit: 2016-11-23 | Discharge: 2016-11-23 | Disposition: A | Discharge status: Home / Self Care | Payer: 59 | Source: Ambulatory Visit | Attending: General Surgery | Admitting: General Surgery

## 2016-11-23 ENCOUNTER — Other Ambulatory Visit (HOSPITAL_COMMUNITY): Payer: Self-pay | Admitting: General Surgery

## 2016-11-23 DIAGNOSIS — Z01818 Encounter for other preprocedural examination: Secondary | ICD-10-CM | POA: Diagnosis not present

## 2016-11-25 ENCOUNTER — Ambulatory Visit (INDEPENDENT_AMBULATORY_CARE_PROVIDER_SITE_OTHER): Payer: 59 | Admitting: Psychiatry

## 2016-11-25 DIAGNOSIS — F509 Eating disorder, unspecified: Secondary | ICD-10-CM

## 2016-12-01 ENCOUNTER — Ambulatory Visit: Payer: Self-pay | Admitting: Skilled Nursing Facility1

## 2016-12-02 ENCOUNTER — Encounter: Payer: 59 | Attending: General Surgery | Admitting: Skilled Nursing Facility1

## 2016-12-02 ENCOUNTER — Encounter: Payer: Self-pay | Admitting: Skilled Nursing Facility1

## 2016-12-02 DIAGNOSIS — Z6841 Body Mass Index (BMI) 40.0 and over, adult: Secondary | ICD-10-CM | POA: Diagnosis not present

## 2016-12-02 DIAGNOSIS — Z713 Dietary counseling and surveillance: Secondary | ICD-10-CM | POA: Diagnosis not present

## 2016-12-02 DIAGNOSIS — E669 Obesity, unspecified: Secondary | ICD-10-CM

## 2016-12-02 NOTE — Progress Notes (Signed)
Pre-Op Assessment Visit:  Pre-Operative RYGB Surgery  Medical Nutrition Therapy:  Appt start time: 10:15 End time: 11:15  Patient was seen on 12/02/2016 for Pre-Operative Nutrition Assessment. Assessment and letter of approval faxed to [facility] Bariatric Surgery Program coordinator on 12/02/2016.  States this is the only appointment he needs. 60/40 pro surgery. Pt states he wears his C-PAP 6 nights a week.  Pt arrives 45 minutes late to his appointment. Pt states he is a drinker needing a lot of fluid. Pt states he is going to go home and think about it (whether he is going to have the surgery or not).   Pt expectation of surgery: to lose the weight, overall feel better, to overcome the negative thoughts about his own body  Pt expectation of Dietitian: to help manage and eat the right things, to be empathetic   Start weight at [facility]: 342.9 BMI: 54.52  24 hr Dietary Recall: First Meal: fast food Snack: muffins Second Meal: fast food Snack: danishes Third Meal: fast food Snack: cheese puffs Beverages: soda, juice, water, coffee  Encouraged to engage in 150 minutes of moderate physical activity including cardiovascular and weight baring weekly  Handouts given during visit include:  . Pre-Op Goals . Bariatric Surgery Protein Shakes During the appointment today the following Pre-Op Goals were reviewed with the patient: . Maintain or lose weight as instructed by your surgeon . Make healthy food choices . Begin to limit portion sizes . Limited concentrated sugars and fried foods . Keep fat/sugar in the single digits per serving on          food labels . Practice CHEWING your food  (aim for 30 chews per bite or until applesauce consistency) . Practice not drinking 15 minutes before, during, and 30 minutes after each meal/snack . Avoid all carbonated beverages  . Avoid/limit caffeinated beverages  . Avoid all sugar-sweetened beverages . Consume 3 meals per day; eat  every 3-5 hours . Make a list of non-food related activities . Aim for 64-100 ounces of FLUID daily  . Aim for at least 60-80 grams of PROTEIN daily . Look for a liquid protein source that contain ?15 g protein and ?5 g carbohydrate  (ex: shakes, drinks, shots)  -Follow diet recommendations listed below   Energy and Macronutrient Recomendations: Calories: 1800 Carbohydrate: 200 Protein: 135 Fat: 50  Demonstrated degree of understanding via:  Teach Back  Teaching Method Utilized: Visual Auditory Hands on  Barriers to learning/adherence to lifestyle change: pre-contemplative stage of change   Patient to call the [facility] to enroll in Pre-Op and Post-Op Nutrition Education when surgery date is scheduled.

## 2016-12-02 NOTE — Patient Instructions (Signed)
Follow Pre-Op Goals Try Protein Shakes Call [facility] at [phone] when surgery is scheduled to enroll in Pre-Op Class  Things to remember:  Please always be honest with us. We want to support you!  If you have any questions or concerns in between appointments, please call or email   The diet after surgery will be high protein and low in carbohydrate.  Vitamins and calcium need to be taken for the rest of your life.  Feel free to include support people in any classes or appointments.   Supplement recommendations:  Before Surgery   1 Complete Multivitamin with Iron  3000 IU Vitamin D3  After Surgery   2 Chewable Multivitamins  **Best Choice - Opurity: Bypass and Sleeve Optimized Chewable    Bariatric Advantage Advanced Multi EA      3 Chewable Calcium (500 mg each, total 1200-1500 mg per day)  **Best Choice - Celebrate, Bariatric Advantage, or Wellesse  Other Options:  2 Celebrate MultiComplete with 18 mg Iron (this provides 6000 IU of  Vitamin D3)  4 Celebrate Essential Multi 2 in 1 (has calcium) + 18-60 mg separate  iron  Vitamins and Calcium are available at:   [facility]   [address], [place], [place] [address]   [email or web address]  [email or web address]  [email or web address]    

## 2016-12-20 ENCOUNTER — Encounter: Payer: 59 | Attending: General Surgery | Admitting: Registered"

## 2016-12-20 ENCOUNTER — Encounter: Payer: Self-pay | Admitting: Registered"

## 2016-12-20 DIAGNOSIS — E669 Obesity, unspecified: Secondary | ICD-10-CM

## 2016-12-20 DIAGNOSIS — Z713 Dietary counseling and surveillance: Secondary | ICD-10-CM | POA: Diagnosis not present

## 2016-12-20 DIAGNOSIS — Z6841 Body Mass Index (BMI) 40.0 and over, adult: Secondary | ICD-10-CM | POA: Diagnosis not present

## 2016-12-20 NOTE — Progress Notes (Signed)
  Pre-Operative Nutrition Class:  Appt start time: 8:15   End time: 9:15.  Patient was seen on 12/21/2014 for Pre-Operative Bariatric Surgery Education at [facility].   Surgery date: TBD Surgery type: RYGB Start weight at [facility]: 342.9 Weight today: 346.0   Samples given per MNT protocol. Patient educated on appropriate usage: Opurity Multivitamin Lot # [id number] Exp: 04/19  Bariatric Advantage Calcium Citrate (strawberry) Lot #[id number] Exp: 02/02/17  Bariatric Advantage Calcium Citrate peanut butter chocolate) Lot # [id number] Exp: 01/21/17  Unjury Protein Shake Lot # [id number] Exp: 06/19/17  The following the learning objectives were met by the patient during this course:  Identify Pre-Op Dietary Goals and will begin 2 weeks pre-operatively  Identify appropriate sources of fluids and proteins   State protein recommendations and appropriate sources pre and post-operatively  Identify Post-Operative Dietary Goals and will follow for 2 weeks post-operatively  Identify appropriate multivitamin and calcium sources  Describe the need for physical activity post-operatively and will follow MD recommendations  State when to call healthcare provider regarding medication questions or post-operative complications  Handouts given during class include:  Pre-Op Bariatric Surgery Diet Handout  Protein Shake Handout  Post-Op Bariatric Surgery Nutrition Handout  [facility] Program Information Flyer  Support Group Information Flyer  [facility] Bariatric Supplements Price List  Follow-Up Plan: Patient will follow-up at [facility] 2 weeks post operatively for diet advancement per MD.

## 2016-12-22 ENCOUNTER — Encounter (HOSPITAL_COMMUNITY): Payer: Self-pay | Admitting: *Deleted

## 2016-12-24 ENCOUNTER — Ambulatory Visit: Payer: Self-pay | Admitting: General Surgery

## 2016-12-24 DIAGNOSIS — G4733 Obstructive sleep apnea (adult) (pediatric): Secondary | ICD-10-CM | POA: Diagnosis not present

## 2016-12-24 DIAGNOSIS — E785 Hyperlipidemia, unspecified: Secondary | ICD-10-CM | POA: Diagnosis not present

## 2016-12-24 DIAGNOSIS — K219 Gastro-esophageal reflux disease without esophagitis: Secondary | ICD-10-CM | POA: Diagnosis not present

## 2016-12-24 DIAGNOSIS — Z9989 Dependence on other enabling machines and devices: Secondary | ICD-10-CM | POA: Diagnosis not present

## 2016-12-24 DIAGNOSIS — G8929 Other chronic pain: Secondary | ICD-10-CM | POA: Diagnosis not present

## 2016-12-24 DIAGNOSIS — M5441 Lumbago with sciatica, right side: Secondary | ICD-10-CM | POA: Diagnosis not present

## 2016-12-24 DIAGNOSIS — Z6841 Body Mass Index (BMI) 40.0 and over, adult: Secondary | ICD-10-CM | POA: Diagnosis not present

## 2016-12-24 NOTE — H&P (Signed)
[name] 12/24/2016 9:59 AM Location: [facility] Patient #: [id number] DOB: 01-31-1981 Married / Language: English / Race: Black or African American Male  History of Present Illness Jesse Hiss M. [name] MD; 12/24/2016 10:02 AM) The patient is a 36 year old male who presents for a bariatric surgery evaluation. He comes in for his preoperative appointment. He has been approved for laparoscopic Roux-en-Y gastric bypass. He denies any significant medical changes since I initially met him. He did end up in the emergency room back in March with right-sided pain. He underwent ultrasound and CT imaging. It appeared that he had musculoskeletal strain. There was no evidence of cholelithiasis or intra-abdominal pathology. And in talking with him about his symptoms it was more consistent with a pulled muscle. His blood work in the emergency room consisting of a comprehensive metabolic panel, CBC, and lipase were also within normal limits. He denies any chest pain, chest pressure, shortness of breath, paroxysmal nocturnal dyspnea, orthopnea. He is still using his CPAP. He still has some reflux issues but is currently not taking any medications. He has been eliminating foods that worsen his reflux and takes cough drops as needed. He denies any melena or hematochezia and any hematuria.  Chest x-ray and upper GI were within normal limits. No hiatal hernia.  Review of systems-a comprehensive 12 point review of systems was negative except for what is mentioned in HPI  08/27/16 He is referred by Dr Seward Carol for evaluation of weight loss surgery. He is accompanied by his wife. He attended the seminar in person. He is interested in the gastric bypass. He has struggled with his weight since his mid 68s. Despite numerous attempts for sustained weight loss he has been unsuccessful. He has tried Atkins, [employer], [employer], fat burners-all without any long-term success.  His  comorbidities include dyslipidemia, obstructive sleep apnea on CPAP, gastroesophageal reflux disease  He denies any chest pain, chest pressure, shortness of breath, orthopnea, paroxysmal nocturnal dyspnea. He may get a little bit short of breath with physical activity. He denies any prior blood clots. He denies any [address] family members with blood clots. Does use CPAP. He does have a history of esophagitis. His reflux is intermittent. He takes Nexium as needed. He did have an issue with esophageal ulcers in 2013 as demonstrated on an upper endoscopy. He has a bowel movement daily. Denies any prior abdominal surgery. Denies any melena or hematochezia. Denies any dysuria or hematuria. He has Center chronic low back pain. He denies any TIAs or amaurosis fugax. He denies any tobacco use. He drinks alcohol on a very rare occasion. Denies any drug use. He is a Company secretary.   Problem List/Past Medical Jesse Hiss M. Redmond Pulling, MD; 12/24/2016 10:05 AM) PRE-OP TESTING (Z01.818) OSA ON CPAP (G47.33) GALLBLADDER POLYP (K82.4) CHRONIC MIDLINE LOW BACK PAIN WITH RIGHT-SIDED SCIATICA (M54.41) GASTROESOPHAGEAL REFLUX DISEASE, ESOPHAGITIS PRESENCE NOT SPECIFIED (K21.9) HYPERLIPIDEMIA, ACQUIRED (E78.5)  Past Surgical History Jesse Hiss M. Redmond Pulling, MD; 12/24/2016 10:05 AM) Oral Surgery  Diagnostic Studies History Jesse Ruff. Redmond Pulling, MD; 12/24/2016 10:05 AM) Colonoscopy never  Allergies Jesse Hiss M. Redmond Pulling, MD; 12/24/2016 10:05 AM) Amoxicillin *PENICILLINS* Allergies Reconciled  Medication History Jesse Hiss M. Redmond Pulling, MD; 12/24/2016 10:05 AM) OxyCODONE HCl (5MG/5ML Solution, 5-10 Milliliter Oral every four hours, as needed, Taken starting 12/24/2016) Active. Ondansetron (4MG Tablet Disint, 1 (one) Tablet Oral every six hours, as needed, Taken starting 12/24/2016) Active. Cetirizine HCl (10MG Tablet, Oral) Active.  Social History Jesse Hiss M. Redmond Pulling, MD; 12/24/2016 10:05 AM) Alcohol use  Occasional alcohol  use. Caffeine use Carbonated beverages, Coffee, Tea. No drug use Tobacco use Never smoker.  Family History Jesse Hiss M. Redmond Pulling, MD; 12/24/2016 10:05 AM) Arthritis Father. Breast Cancer Family Members In General, Mother. Colon Polyps Father. Hypertension Father.  Other Problems Jesse Hiss M. Redmond Pulling, MD; 12/24/2016 10:05 AM) Gastroesophageal Reflux Disease Hypercholesterolemia Inguinal Hernia Sleep Apnea OBESITY, MORBID, BMI 40.0-49.9 (E66.01)     Physical Exam Jesse Hiss M. Jesse Dyk MD; 12/24/2016 10:04 AM)  General Mental Status-Alert. General Appearance-Consistent with stated age. Hydration-Well hydrated. Voice-Normal. Note: central truncal obesity  Head and Neck Head-normocephalic, atraumatic with no lesions or palpable masses. Trachea-midline. Thyroid Gland Characteristics - normal size and consistency.  Eye Eyeball - Bilateral-Extraocular movements intact. Sclera/Conjunctiva - Bilateral-No scleral icterus.  ENMT Note: normal external ears lips intact  Chest and Lung Exam Chest and lung exam reveals -quiet, even and easy respiratory effort with no use of accessory muscles and on auscultation, normal breath sounds, no adventitious sounds and normal vocal resonance. Inspection Chest Wall - Normal. Back - normal.  Breast - Did not examine.  Cardiovascular Cardiovascular examination reveals -normal heart sounds, regular rate and rhythm with no murmurs and normal pedal pulses bilaterally.  Abdomen Inspection Inspection of the abdomen reveals - No Hernias. Skin - Scar - no surgical scars. Palpation/Percussion Palpation and Percussion of the abdomen reveal - Soft, Non Tender, No Rebound tenderness, No Rigidity (guarding) and No hepatosplenomegaly. Auscultation Auscultation of the abdomen reveals - Bowel sounds normal.  Peripheral Vascular Upper Extremity Palpation - Pulses bilaterally normal.  Neurologic Neurologic evaluation reveals  -alert and oriented x 3 with no impairment of recent or remote memory. Mental Status-Normal.  Neuropsychiatric The patient's mood and affect are described as -normal. Judgment and Insight-insight is appropriate concerning matters relevant to self.  Musculoskeletal Normal Exam - Left-Upper Extremity Strength Normal and Lower Extremity Strength Normal. Normal Exam - Right-Upper Extremity Strength Normal and Lower Extremity Strength Normal.  Lymphatic Head & Neck  General Head & Neck Lymphatics: Bilateral - Description - Normal. Axillary - Did not examine. Femoral & Inguinal - Did not examine.    Assessment & Plan Jesse Hiss M. Tavionna Grout MD; 12/24/2016 10:04 AM)  MORBID OBESITY WITH BMI OF 50.0-59.9, ADULT (E66.01) Impression: We reviewed his preoperative workup discussed the findings of his upper GI. The patient was under the impression that he had a hiatal hernia. His upper endoscopy from 2013 mentioned no evidence of a hiatal hernia nor did his upper GI. However we did talk about that in some instances the upper GI can miss certain findings. I did assure him that if we did come across a hiatal hernia intraoperatively that we would repair it. We rediscussed the typical hospitalization as well as the typical postoperative course. We rediscussed the big important complications that can occur. He was given his postoperative prescriptions for pain and nausea. We discussed the importance of the preoperative diet. All of his questions were asked and answered.  Current Plans Pt Education - EMW_preopbariatric OSA ON CPAP (G47.33)  GASTROESOPHAGEAL REFLUX DISEASE, ESOPHAGITIS PRESENCE NOT SPECIFIED (K21.9)  HYPERLIPIDEMIA, ACQUIRED (E78.5)  CHRONIC MIDLINE LOW BACK PAIN WITH RIGHT-SIDED SCIATICA (M54.41)  Jesse Ruff. Redmond Pulling, MD, FACS General, Bariatric, & Minimally Invasive Surgery [facility], [place]

## 2017-01-11 ENCOUNTER — Encounter (HOSPITAL_COMMUNITY): Payer: Self-pay

## 2017-01-11 NOTE — Patient Instructions (Signed)
[name]  01/11/2017   Your procedure is scheduled on: 01/17/17  Report [facility]  Entrance Take [place]  elevators to 3rd floor to  Short Stay Center at      [phone].     Call this number if you have problems the morning of surgery [phone]   Remember: ONLY 1 PERSON MAY GO WITH YOU TO SHORT STAY TO GET  READY MORNING OF YOUR SURGERY.  Do not eat food or drink liquids :After Midnight.     Take these medicines the morning of surgery with A SIP OF WATER: none                                You may not have any metal on your body including hair pins and              piercings  Do not wear jewelry, , lotions, powders or perfumes, deodorant                         Men may shave face and neck.   Do not bring valuables to the hospital. [facility] IS NOT             RESPONSIBLE   FOR VALUABLES.  Contacts, dentures or bridgework may not be worn into surgery.  Leave suitcase in the car. After surgery it may be brought to your room.                 Please read over the following fact sheets you were given: _____________________________________________________________________            [facility] - Preparing for [facility] Before surgery, you can play an important role.  Because skin is not sterile, your skin needs to be as free of germs as possible.  You can reduce the number of germs on your skin by washing with CHG (chlorahexidine gluconate) soap before surgery.  CHG is an antiseptic cleaner which kills germs and bonds with the skin to continue killing germs even after washing. Please DO NOT use if you have an allergy to CHG or antibacterial soaps.  If your skin becomes reddened/irritated stop using the CHG and inform your nurse when you arrive at Short Stay. Do not shave (including legs and underarms) for at least 48 hours prior to the first CHG shower.  You may shave your face/neck. Please follow these instructions carefully:  1.  Shower with CHG  Soap the night before surgery and the  morning of Surgery.  2.  If you choose to wash your hair, wash your hair first as usual with your  normal  shampoo.  3.  After you shampoo, rinse your hair and body thoroughly to remove the  shampoo.                           4.  Use CHG as you would any other liquid soap.  You can apply chg directly  to the skin and wash                       Gently with a scrungie or clean washcloth.  5.  Apply the CHG Soap to your body ONLY FROM THE NECK DOWN.   Do  not use on face/ open                           Wound or open sores. Avoid contact with eyes, ears mouth and genitals (private parts).                       Wash face,  Genitals (private parts) with your normal soap.             6.  Wash thoroughly, paying special attention to the area where your surgery  will be performed.  7.  Thoroughly rinse your body with warm water from the neck down.  8.  DO NOT shower/wash with your normal soap after using and rinsing off  the CHG Soap.                9.  Pat yourself dry with a clean towel.            10.  Wear clean pajamas.            11.  Place clean sheets on your bed the night of your first shower and do not  sleep with pets. Day of Surgery : Do not apply any lotions/deodorants the morning of surgery.  Please wear clean clothes to the hospital/surgery center.  FAILURE TO FOLLOW THESE INSTRUCTIONS MAY RESULT IN THE CANCELLATION OF YOUR SURGERY PATIENT SIGNATURE_________________________________  NURSE SIGNATURE__________________________________  ________________________________________________________________________   [name]  An incentive spirometer is a tool that can help keep your lungs clear and active. This tool measures how well you are filling your lungs with each breath. Taking long deep breaths may help reverse or decrease the chance of developing breathing (pulmonary) problems (especially infection) following:  A long period of time  when you are unable to move or be active. BEFORE THE PROCEDURE   If the spirometer includes an indicator to show your best effort, your nurse or respiratory therapist will set it to a desired goal.  If possible, sit up straight or lean slightly forward. Try not to slouch.  Hold the incentive spirometer in an upright position. INSTRUCTIONS FOR USE  1. Sit on the edge of your bed if possible, or sit up as far as you can in bed or on a chair. 2. Hold the incentive spirometer in an upright position. 3. Breathe out normally. 4. Place the mouthpiece in your mouth and seal your lips tightly around it. 5. Breathe in slowly and as deeply as possible, raising the piston or the ball toward the top of the column. 6. Hold your breath for 3-5 seconds or for as long as possible. Allow the piston or ball to fall to the bottom of the column. 7. Remove the mouthpiece from your mouth and breathe out normally. 8. Rest for a few seconds and repeat Steps 1 through 7 at least 10 times every 1-2 hours when you are awake. Take your time and take a few normal breaths between deep breaths. 9. The spirometer may include an indicator to show your best effort. Use the indicator as a goal to work toward during each repetition. 10. After each set of 10 deep breaths, practice coughing to be sure your lungs are clear. If you have an incision (the cut made at the time of surgery), support your incision when coughing by placing a pillow or rolled up towels firmly against it. Once you are able to get out of bed,  walk around indoors and cough well. You may stop using the incentive spirometer when instructed by your caregiver.  RISKS AND COMPLICATIONS  Take your time so you do not get dizzy or light-headed.  If you are in pain, you may need to take or ask for pain medication before doing incentive spirometry. It is harder to take a deep breath if you are having pain. AFTER USE  Rest and breathe slowly and easily.  It can be  helpful to keep track of a log of your progress. Your caregiver can provide you with a simple table to help with this. If you are using the spirometer at home, follow these instructions: [place] IF:   You are having difficultly using the spirometer.  You have trouble using the spirometer as often as instructed.  Your pain medication is not giving enough relief while using the spirometer.  You develop fever of 100.5 F (38.1 C) or higher. SEEK IMMEDIATE MEDICAL CARE IF:   You cough up bloody sputum that had not been present before.  You develop fever of 102 F (38.9 C) or greater.  You develop worsening pain at or near the incision site. MAKE SURE YOU:   Understand these instructions.  Will watch your condition.  Will get help right away if you are not doing well or get worse. Document Released: 12/13/2006 Document Revised: 10/25/2011 Document Reviewed: 02/13/2007 [facility] Patient Information 2014 [place], [place].   ________________________________________________________________________

## 2017-01-13 ENCOUNTER — Encounter (HOSPITAL_COMMUNITY): Payer: Self-pay

## 2017-01-13 ENCOUNTER — Encounter (HOSPITAL_COMMUNITY)
Admission: RE | Admit: 2017-01-13 | Discharge: 2017-01-13 | Disposition: A | Discharge status: Home / Self Care | Payer: 59 | Source: Ambulatory Visit | Attending: General Surgery | Admitting: General Surgery

## 2017-01-13 DIAGNOSIS — Z01812 Encounter for preprocedural laboratory examination: Secondary | ICD-10-CM | POA: Insufficient documentation

## 2017-01-13 HISTORY — DX: Unspecified asthma, uncomplicated: J45.909

## 2017-01-13 HISTORY — DX: Sleep apnea, unspecified: G47.30

## 2017-01-13 MED FILL — oxyCODONE HCL 5 MG/5ML SOLN: 5 | 2 days supply | Qty: 100 | Fill #0

## 2017-01-13 NOTE — Progress Notes (Signed)
ekg 11/08/16 epic cxr 11/23/16 epic

## 2017-01-14 ENCOUNTER — Encounter (HOSPITAL_COMMUNITY)
Admission: RE | Admit: 2017-01-14 | Discharge: 2017-01-14 | Disposition: A | Discharge status: Home / Self Care | Payer: 59 | Source: Ambulatory Visit | Attending: General Surgery | Admitting: General Surgery

## 2017-01-14 DIAGNOSIS — Z01812 Encounter for preprocedural laboratory examination: Secondary | ICD-10-CM | POA: Diagnosis not present

## 2017-01-14 LAB — CBC
HCT: 39.8 % (ref 39.0–52.0)
Hemoglobin: 13.2 g/dL (ref 13.0–17.0)
MCH: 27.2 pg (ref 26.0–34.0)
MCHC: 33.2 g/dL (ref 30.0–36.0)
MCV: 82.1 fL (ref 78.0–100.0)
PLATELETS: 260 10*3/uL (ref 150–400)
RBC: 4.85 MIL/uL (ref 4.22–5.81)
RDW: 13.2 % (ref 11.5–15.5)
WBC: 5.9 10*3/uL (ref 4.0–10.5)

## 2017-01-14 LAB — DIFFERENTIAL
Basophils Absolute: 0 10*3/uL (ref 0.0–0.1)
Basophils Relative: 1 %
EOS PCT: 2 %
Eosinophils Absolute: 0.1 10*3/uL (ref 0.0–0.7)
LYMPHS PCT: 34 %
Lymphs Abs: 2 10*3/uL (ref 0.7–4.0)
MONO ABS: 0.5 10*3/uL (ref 0.1–1.0)
MONOS PCT: 9 %
Neutro Abs: 3.2 10*3/uL (ref 1.7–7.7)
Neutrophils Relative %: 54 %

## 2017-01-14 LAB — COMPREHENSIVE METABOLIC PANEL
ALT: 29 U/L (ref 17–63)
AST: 20 U/L (ref 15–41)
Albumin: 3.7 g/dL (ref 3.5–5.0)
Alkaline Phosphatase: 80 U/L (ref 38–126)
Anion gap: 9 (ref 5–15)
BUN: 14 mg/dL (ref 6–20)
CHLORIDE: 103 mmol/L (ref 101–111)
CO2: 26 mmol/L (ref 22–32)
CREATININE: 0.89 mg/dL (ref 0.61–1.24)
Calcium: 9.1 mg/dL (ref 8.9–10.3)
GFR calc Af Amer: >60 mL/min (ref >60)
GFR calc non Af Amer: >60 mL/min (ref >60)
Glucose, Bld: 92 mg/dL (ref 65–99)
Potassium: 3.8 mmol/L (ref 3.5–5.1)
SODIUM: 138 mmol/L (ref 135–145)
Total Bilirubin: 0.3 mg/dL (ref 0.3–1.2)
Total Protein: 7.7 g/dL (ref 6.5–8.1)

## 2017-01-16 NOTE — Anesthesia Preprocedure Evaluation (Addendum)
Anesthesia Evaluation  Patient identified by MRN, date of birth, ID band Patient awake    Reviewed: Allergy & Precautions, H&P , NPO status , Patient's Chart, lab work & pertinent test results  Airway Mallampati: III  TM Distance: >3 FB Neck ROM: Full    Dental no notable dental hx. (+) Teeth Intact, Dental Advisory Given   Pulmonary asthma , sleep apnea and Continuous Positive Airway Pressure Ventilation ,    Pulmonary exam normal breath sounds clear to auscultation       Cardiovascular Exercise Tolerance: Good negative cardio ROS   Rhythm:Regular Rate:Normal     Neuro/Psych negative neurological ROS  negative psych ROS   GI/Hepatic Neg liver ROS, GERD  Medicated and Controlled,  Endo/Other  Morbid obesity  Renal/GU negative Renal ROS  negative genitourinary   Musculoskeletal   Abdominal   Peds  Hematology negative hematology ROS (+)   Anesthesia Other Findings   Reproductive/Obstetrics negative OB ROS                           Anesthesia Physical Anesthesia Plan  ASA: III  Anesthesia Plan: General   Post-op Pain Management:    Induction: Intravenous  Airway Management Planned: Oral ETT  Additional Equipment:   Intra-op Plan:   Post-operative Plan: Extubation in OR  Informed Consent: I have reviewed the patients History and Physical, chart, labs and discussed the procedure including the risks, benefits and alternatives for the proposed anesthesia with the patient or authorized representative who has indicated his/her understanding and acceptance.   Dental advisory given  Plan Discussed with: CRNA  Anesthesia Plan Comments:         Anesthesia Quick Evaluation

## 2017-01-17 ENCOUNTER — Inpatient Hospital Stay (HOSPITAL_COMMUNITY)
Admission: RE | Admit: 2017-01-17 | Discharge: 2017-01-20 | DRG: 982 | Disposition: A | Discharge status: Home Health | Payer: 59 | Source: Ambulatory Visit | Attending: General Surgery | Admitting: General Surgery

## 2017-01-17 ENCOUNTER — Inpatient Hospital Stay (HOSPITAL_COMMUNITY): Payer: 59 | Admitting: Anesthesiology

## 2017-01-17 ENCOUNTER — Encounter (HOSPITAL_COMMUNITY)
Admission: RE | Disposition: A | Discharge status: Home Health | Payer: Self-pay | Source: Ambulatory Visit | Attending: General Surgery

## 2017-01-17 ENCOUNTER — Encounter (HOSPITAL_COMMUNITY): Payer: Self-pay | Admitting: *Deleted

## 2017-01-17 DIAGNOSIS — Z8261 Family history of arthritis: Secondary | ICD-10-CM | POA: Diagnosis not present

## 2017-01-17 DIAGNOSIS — Z8371 Family history of colonic polyps: Secondary | ICD-10-CM

## 2017-01-17 DIAGNOSIS — E7800 Pure hypercholesterolemia, unspecified: Secondary | ICD-10-CM | POA: Diagnosis not present

## 2017-01-17 DIAGNOSIS — M5441 Lumbago with sciatica, right side: Secondary | ICD-10-CM | POA: Diagnosis present

## 2017-01-17 DIAGNOSIS — G4733 Obstructive sleep apnea (adult) (pediatric): Secondary | ICD-10-CM

## 2017-01-17 DIAGNOSIS — Z803 Family history of malignant neoplasm of breast: Secondary | ICD-10-CM | POA: Diagnosis not present

## 2017-01-17 DIAGNOSIS — Z9884 Bariatric surgery status: Secondary | ICD-10-CM

## 2017-01-17 DIAGNOSIS — Z6841 Body Mass Index (BMI) 40.0 and over, adult: Secondary | ICD-10-CM

## 2017-01-17 DIAGNOSIS — Z9989 Dependence on other enabling machines and devices: Secondary | ICD-10-CM

## 2017-01-17 DIAGNOSIS — E785 Hyperlipidemia, unspecified: Secondary | ICD-10-CM | POA: Diagnosis present

## 2017-01-17 DIAGNOSIS — K219 Gastro-esophageal reflux disease without esophagitis: Secondary | ICD-10-CM | POA: Diagnosis present

## 2017-01-17 DIAGNOSIS — Z8249 Family history of ischemic heart disease and other diseases of the circulatory system: Secondary | ICD-10-CM

## 2017-01-17 DIAGNOSIS — K824 Cholesterolosis of gallbladder: Secondary | ICD-10-CM | POA: Diagnosis not present

## 2017-01-17 DIAGNOSIS — E662 Morbid (severe) obesity with alveolar hypoventilation: Principal | ICD-10-CM | POA: Diagnosis present

## 2017-01-17 HISTORY — DX: Obstructive sleep apnea (adult) (pediatric): G47.33

## 2017-01-17 HISTORY — PX: GASTRIC ROUX-EN-Y: SHX5262

## 2017-01-17 HISTORY — DX: Morbid (severe) obesity due to excess calories: E66.01

## 2017-01-17 LAB — HEMOGLOBIN AND HEMATOCRIT, BLOOD
HCT: 37.6 % — ABNORMAL LOW (ref 39.0–52.0)
Hemoglobin: 12.4 g/dL — ABNORMAL LOW (ref 13.0–17.0)

## 2017-01-17 SURGERY — LAPAROSCOPIC ROUX-EN-Y GASTRIC BYPASS WITH UPPER ENDOSCOPY
Anesthesia: General

## 2017-01-17 MED ORDER — KETAMINE HCL 10 MG/ML IJ SOLN
INTRAMUSCULAR | Status: AC
  Filled 2017-01-17: qty 1

## 2017-01-17 MED ORDER — ONDANSETRON HCL 4 MG/2ML IJ SOLN
INTRAMUSCULAR | Status: AC
  Filled 2017-01-17: qty 2

## 2017-01-17 MED ORDER — PHENYLEPHRINE 40 MCG/ML (10ML) SYRINGE FOR IV PUSH (FOR BLOOD PRESSURE SUPPORT)
PREFILLED_SYRINGE | INTRAVENOUS | Status: AC
  Filled 2017-01-17: qty 20

## 2017-01-17 MED ORDER — GLYCOPYRROLATE 0.2 MG/ML IV SOSY
PREFILLED_SYRINGE | INTRAVENOUS | Status: DC | PRN
Start: 2017-01-17 — End: 2017-01-17
  Administered 2017-01-17 (×2): .1 mg via INTRAVENOUS

## 2017-01-17 MED ORDER — HYDROMORPHONE HCL 1 MG/ML IJ SOLN
0.2500 mg | INTRAMUSCULAR | Status: DC | PRN
  Administered 2017-01-17 (×4): 0.5 mg via INTRAVENOUS

## 2017-01-17 MED ORDER — SODIUM CHLORIDE 0.9 % IJ SOLN
INTRAMUSCULAR | Status: AC
  Filled 2017-01-17: qty 50

## 2017-01-17 MED ORDER — HYDROMORPHONE HCL 1 MG/ML IJ SOLN
INTRAMUSCULAR | Status: AC
  Administered 2017-01-17: 0.5 mg via INTRAVENOUS
  Filled 2017-01-17: qty 1

## 2017-01-17 MED ORDER — SUCCINYLCHOLINE CHLORIDE 200 MG/10ML IV SOSY
PREFILLED_SYRINGE | INTRAVENOUS | Status: DC | PRN
  Administered 2017-01-17: 120 mg via INTRAVENOUS

## 2017-01-17 MED ORDER — EVICEL 5 ML EX KIT
PACK | Freq: Once | CUTANEOUS | Status: AC
  Administered 2017-01-17: 1
  Filled 2017-01-17: qty 1

## 2017-01-17 MED ORDER — FENTANYL CITRATE (PF) 250 MCG/5ML IJ SOLN
INTRAMUSCULAR | Status: AC
  Filled 2017-01-17: qty 5

## 2017-01-17 MED ORDER — DEXAMETHASONE SODIUM PHOSPHATE 4 MG/ML IJ SOLN
4.0000 mg | INTRAMUSCULAR | Status: AC
  Administered 2017-01-17: 10 mg via INTRAVENOUS

## 2017-01-17 MED ORDER — LEVOFLOXACIN IN D5W 750 MG/150ML IV SOLN
INTRAVENOUS | Status: AC
  Filled 2017-01-17: qty 150

## 2017-01-17 MED ORDER — GLYCOPYRROLATE 0.2 MG/ML IV SOSY
PREFILLED_SYRINGE | INTRAVENOUS | Status: AC
  Filled 2017-01-17: qty 5

## 2017-01-17 MED ORDER — DEXMEDETOMIDINE HCL IN NACL 200 MCG/50ML IV SOLN
INTRAVENOUS | Status: AC
  Filled 2017-01-17: qty 50

## 2017-01-17 MED ORDER — ACETAMINOPHEN 325 MG PO TABS: 650.0000 mg | ORAL_TABLET | ORAL | Status: DC | PRN

## 2017-01-17 MED ORDER — PROPOFOL 10 MG/ML IV BOLUS
INTRAVENOUS | Status: AC
  Filled 2017-01-17: qty 20

## 2017-01-17 MED ORDER — MIDAZOLAM HCL 2 MG/2ML IJ SOLN
INTRAMUSCULAR | Status: AC
  Filled 2017-01-17: qty 2

## 2017-01-17 MED ORDER — OXYCODONE HCL 5 MG/5ML PO SOLN
5.0000 mg | ORAL | Status: DC | PRN
  Administered 2017-01-18 – 2017-01-20 (×13): 10 mg via ORAL
  Filled 2017-01-17 (×9): qty 10
  Filled 2017-01-17: qty 5
  Filled 2017-01-17 (×4): qty 10

## 2017-01-17 MED ORDER — FENTANYL CITRATE (PF) 100 MCG/2ML IJ SOLN
INTRAMUSCULAR | Status: DC | PRN
  Administered 2017-01-17: 100 ug via INTRAVENOUS
  Administered 2017-01-17 (×2): 50 ug via INTRAVENOUS

## 2017-01-17 MED ORDER — SUCCINYLCHOLINE CHLORIDE 200 MG/10ML IV SOSY
PREFILLED_SYRINGE | INTRAVENOUS | Status: AC
  Filled 2017-01-17: qty 10

## 2017-01-17 MED ORDER — LIDOCAINE 2% (20 MG/ML) 5 ML SYRINGE
INTRAMUSCULAR | Status: AC
  Filled 2017-01-17: qty 15

## 2017-01-17 MED ORDER — METOCLOPRAMIDE HCL 5 MG/ML IJ SOLN
INTRAMUSCULAR | Status: AC
  Filled 2017-01-17: qty 2

## 2017-01-17 MED ORDER — SUGAMMADEX SODIUM 200 MG/2ML IV SOLN
INTRAVENOUS | Status: DC | PRN
  Administered 2017-01-17: 500 mg via INTRAVENOUS

## 2017-01-17 MED ORDER — ROCURONIUM BROMIDE 50 MG/5ML IV SOSY
PREFILLED_SYRINGE | INTRAVENOUS | Status: DC | PRN
  Administered 2017-01-17 (×2): 20 mg via INTRAVENOUS
  Administered 2017-01-17: 10 mg via INTRAVENOUS
  Administered 2017-01-17: 50 mg via INTRAVENOUS
  Administered 2017-01-17: 30 mg via INTRAVENOUS

## 2017-01-17 MED ORDER — DIPHENHYDRAMINE HCL 50 MG/ML IJ SOLN: 12.5000 mg | Freq: Three times a day (TID) | INTRAMUSCULAR | Status: DC | PRN

## 2017-01-17 MED ORDER — BUPIVACAINE LIPOSOME 1.3 % IJ SUSP
20.0000 mL | Freq: Once | INTRAMUSCULAR | Status: DC
  Filled 2017-01-17: qty 20

## 2017-01-17 MED ORDER — LIDOCAINE 2% (20 MG/ML) 5 ML SYRINGE
INTRAMUSCULAR | Status: DC | PRN
  Administered 2017-01-17: 1.5 mg/kg/h via INTRAVENOUS

## 2017-01-17 MED ORDER — HEPARIN SODIUM (PORCINE) 5000 UNIT/ML IJ SOLN
5000.0000 [IU] | INTRAMUSCULAR | Status: AC
  Administered 2017-01-17: 5000 [IU] via SUBCUTANEOUS
  Filled 2017-01-17: qty 1

## 2017-01-17 MED ORDER — STERILE WATER FOR IRRIGATION IR SOLN
Status: DC | PRN
  Administered 2017-01-17: 2000 mL

## 2017-01-17 MED ORDER — LIDOCAINE 2% (20 MG/ML) 5 ML SYRINGE
INTRAMUSCULAR | Status: DC | PRN
  Administered 2017-01-17: 100 mg via INTRAVENOUS

## 2017-01-17 MED ORDER — LEVOFLOXACIN IN D5W 750 MG/150ML IV SOLN
750.0000 mg | INTRAVENOUS | Status: AC
  Administered 2017-01-17: 750 mg via INTRAVENOUS

## 2017-01-17 MED ORDER — MIDAZOLAM HCL 5 MG/5ML IJ SOLN
INTRAMUSCULAR | Status: DC | PRN
  Administered 2017-01-17: 2 mg via INTRAVENOUS

## 2017-01-17 MED ORDER — ACETAMINOPHEN 160 MG/5ML PO SOLN
325.0000 mg | ORAL | Status: DC | PRN
  Administered 2017-01-18 – 2017-01-20 (×10): 650 mg via ORAL
  Filled 2017-01-17 (×10): qty 20.3

## 2017-01-17 MED ORDER — MORPHINE SULFATE (PF) 2 MG/ML IV SOLN
1.0000 mg | INTRAVENOUS | Status: DC | PRN
  Administered 2017-01-17: 3 mg via INTRAVENOUS
  Administered 2017-01-17: 2 mg via INTRAVENOUS
  Administered 2017-01-17: 3 mg via INTRAVENOUS
  Administered 2017-01-17: 1 mg via INTRAVENOUS
  Administered 2017-01-17: 3 mg via INTRAVENOUS
  Administered 2017-01-18: 2 mg via INTRAVENOUS
  Administered 2017-01-18 (×2): 3 mg via INTRAVENOUS
  Filled 2017-01-17 (×3): qty 2
  Filled 2017-01-17 (×2): qty 1
  Filled 2017-01-17 (×2): qty 2
  Filled 2017-01-17: qty 1
  Filled 2017-01-17: qty 2

## 2017-01-17 MED ORDER — ROCURONIUM BROMIDE 50 MG/5ML IV SOSY
PREFILLED_SYRINGE | INTRAVENOUS | Status: AC
  Filled 2017-01-17: qty 15

## 2017-01-17 MED ORDER — KETAMINE HCL 10 MG/ML IJ SOLN
INTRAMUSCULAR | Status: DC | PRN
  Administered 2017-01-17: 2.5 mg via INTRAVENOUS
  Administered 2017-01-17: 5 mg via INTRAVENOUS
  Administered 2017-01-17 (×2): 2.5 mg via INTRAVENOUS
  Administered 2017-01-17: 30 mg via INTRAVENOUS
  Administered 2017-01-17: 2.5 mg via INTRAVENOUS

## 2017-01-17 MED ORDER — PHENYLEPHRINE 40 MCG/ML (10ML) SYRINGE FOR IV PUSH (FOR BLOOD PRESSURE SUPPORT)
PREFILLED_SYRINGE | INTRAVENOUS | Status: DC | PRN
  Administered 2017-01-17: 40 ug via INTRAVENOUS
  Administered 2017-01-17: 120 ug via INTRAVENOUS
  Administered 2017-01-17 (×2): 80 ug via INTRAVENOUS
  Administered 2017-01-17: 40 ug via INTRAVENOUS
  Administered 2017-01-17: 120 ug via INTRAVENOUS
  Administered 2017-01-17 (×2): 80 ug via INTRAVENOUS
  Administered 2017-01-17: 40 ug via INTRAVENOUS
  Administered 2017-01-17: 120 ug via INTRAVENOUS
  Administered 2017-01-17 (×3): 80 ug via INTRAVENOUS

## 2017-01-17 MED ORDER — EPHEDRINE 5 MG/ML INJ
INTRAVENOUS | Status: AC
  Filled 2017-01-17: qty 10

## 2017-01-17 MED ORDER — ONDANSETRON HCL 4 MG/2ML IJ SOLN
4.0000 mg | Freq: Four times a day (QID) | INTRAMUSCULAR | Status: DC | PRN
  Administered 2017-01-17 – 2017-01-20 (×3): 4 mg via INTRAVENOUS
  Filled 2017-01-17 (×3): qty 2

## 2017-01-17 MED ORDER — SODIUM CHLORIDE 0.9 % IJ SOLN
INTRAMUSCULAR | Status: DC | PRN
  Administered 2017-01-17: 50 mL

## 2017-01-17 MED ORDER — ROCURONIUM BROMIDE 50 MG/5ML IV SOSY
PREFILLED_SYRINGE | INTRAVENOUS | Status: AC
  Filled 2017-01-17: qty 5

## 2017-01-17 MED ORDER — EPHEDRINE SULFATE-NACL 50-0.9 MG/10ML-% IV SOSY
PREFILLED_SYRINGE | INTRAVENOUS | Status: DC | PRN
  Administered 2017-01-17 (×2): 5 mg via INTRAVENOUS

## 2017-01-17 MED ORDER — SUGAMMADEX SODIUM 500 MG/5ML IV SOLN
INTRAVENOUS | Status: AC
  Filled 2017-01-17: qty 5

## 2017-01-17 MED ORDER — ENOXAPARIN SODIUM 30 MG/0.3ML ~~LOC~~ SOLN
30.0000 mg | Freq: Two times a day (BID) | SUBCUTANEOUS | Status: DC
  Administered 2017-01-18 – 2017-01-20 (×4): 30 mg via SUBCUTANEOUS
  Filled 2017-01-17 (×4): qty 0.3

## 2017-01-17 MED ORDER — ALBUTEROL SULFATE (2.5 MG/3ML) 0.083% IN NEBU: 3.0000 mL | INHALATION_SOLUTION | Freq: Four times a day (QID) | RESPIRATORY_TRACT | Status: DC | PRN

## 2017-01-17 MED ORDER — LIDOCAINE 2% (20 MG/ML) 5 ML SYRINGE
INTRAMUSCULAR | Status: AC
  Filled 2017-01-17: qty 5

## 2017-01-17 MED ORDER — PROPOFOL 10 MG/ML IV BOLUS
INTRAVENOUS | Status: DC | PRN
  Administered 2017-01-17: 100 mg via INTRAVENOUS
  Administered 2017-01-17: 200 mg via INTRAVENOUS

## 2017-01-17 MED ORDER — PROMETHAZINE HCL 25 MG/ML IJ SOLN: 12.5000 mg | Freq: Four times a day (QID) | INTRAMUSCULAR | Status: DC | PRN

## 2017-01-17 MED ORDER — SODIUM CHLORIDE 0.9 % IJ SOLN: INTRAMUSCULAR | Status: DC | PRN

## 2017-01-17 MED ORDER — ACETAMINOPHEN 500 MG PO TABS
1000.0000 mg | ORAL_TABLET | ORAL | Status: AC
  Administered 2017-01-17: 1000 mg via ORAL
  Filled 2017-01-17: qty 2

## 2017-01-17 MED ORDER — GABAPENTIN 300 MG PO CAPS
300.0000 mg | ORAL_CAPSULE | ORAL | Status: AC
  Administered 2017-01-17: 300 mg via ORAL
  Filled 2017-01-17: qty 1

## 2017-01-17 MED ORDER — DEXTROSE 5 % IV SOLN
INTRAVENOUS | Status: DC | PRN
  Administered 2017-01-17: 50 ug/min via INTRAVENOUS

## 2017-01-17 MED ORDER — DEXMEDETOMIDINE HCL 200 MCG/2ML IV SOLN
INTRAVENOUS | Status: DC | PRN
  Administered 2017-01-17 (×3): 8 ug via INTRAVENOUS

## 2017-01-17 MED ORDER — APREPITANT 40 MG PO CAPS
40.0000 mg | ORAL_CAPSULE | ORAL | Status: AC
  Administered 2017-01-17: 40 mg via ORAL
  Filled 2017-01-17: qty 1

## 2017-01-17 MED ORDER — KCL IN DEXTROSE-NACL 20-5-0.45 MEQ/L-%-% IV SOLN
INTRAVENOUS | Status: DC
  Administered 2017-01-17: 15:00:00 via INTRAVENOUS
  Administered 2017-01-18: 1000 mL via INTRAVENOUS
  Administered 2017-01-19 – 2017-01-20 (×3): via INTRAVENOUS
  Filled 2017-01-17 (×9): qty 1000

## 2017-01-17 MED ORDER — PREMIER PROTEIN SHAKE
2.0000 [oz_av] | ORAL | Status: DC
  Administered 2017-01-19 – 2017-01-20 (×9): 2 [oz_av] via ORAL
  Filled 2017-01-17 (×20): qty 325.31

## 2017-01-17 MED ORDER — DEXAMETHASONE SODIUM PHOSPHATE 10 MG/ML IJ SOLN
INTRAMUSCULAR | Status: AC
  Filled 2017-01-17: qty 1

## 2017-01-17 MED ORDER — PANTOPRAZOLE SODIUM 40 MG IV SOLR
40.0000 mg | Freq: Every day | INTRAVENOUS | Status: DC
  Administered 2017-01-17 – 2017-01-19 (×3): 40 mg via INTRAVENOUS
  Filled 2017-01-17 (×3): qty 40

## 2017-01-17 MED ORDER — LACTATED RINGERS IR SOLN
Status: DC | PRN
  Administered 2017-01-17: 1000 mL

## 2017-01-17 MED ORDER — LACTATED RINGERS IV SOLN
INTRAVENOUS | Status: DC | PRN
  Administered 2017-01-17 (×3): via INTRAVENOUS

## 2017-01-17 MED ORDER — SCOPOLAMINE 1 MG/3DAYS TD PT72
1.0000 | MEDICATED_PATCH | TRANSDERMAL | Status: DC
  Administered 2017-01-17: 1.5 mg via TRANSDERMAL
  Filled 2017-01-17: qty 1

## 2017-01-17 MED ORDER — BUPIVACAINE LIPOSOME 1.3 % IJ SUSP
INTRAMUSCULAR | Status: DC | PRN
  Administered 2017-01-17: 20 mL

## 2017-01-17 SURGICAL SUPPLY — 78 items
ADH SKN CLS APL DERMABOND .7 (GAUZE/BANDAGES/DRESSINGS)
APL SKNCLS STERI-STRIP NONHPOA (GAUZE/BANDAGES/DRESSINGS) ×1
APPLICATOR COTTON TIP 6IN STRL (MISCELLANEOUS) ×4 IMPLANT
APPLIER CLIP ROT 13.4 12 LRG (CLIP)
APR CLP LRG 13.4X12 ROT 20 MLT (CLIP)
BENZOIN TINCTURE PRP APPL 2/3 (GAUZE/BANDAGES/DRESSINGS) ×1 IMPLANT
BLADE SURG SZ11 CARB STEEL (BLADE) ×2 IMPLANT
CABLE HIGH FREQUENCY MONO STRZ (ELECTRODE) IMPLANT
CHLORAPREP W/TINT 26ML (MISCELLANEOUS) ×4 IMPLANT
CLIP APPLIE ROT 13.4 12 LRG (CLIP) IMPLANT
CLIP SUT LAPRA TY ABSORB (SUTURE) ×4 IMPLANT
CUTTER FLEX LINEAR 45M (STAPLE) IMPLANT
DERMABOND ADVANCED (GAUZE/BANDAGES/DRESSINGS)
DERMABOND ADVANCED .7 DNX12 (GAUZE/BANDAGES/DRESSINGS) IMPLANT
DEVICE SUT QUICK LOAD TK 5 (STAPLE) IMPLANT
DEVICE SUT TI-KNOT TK 5X26 (MISCELLANEOUS) IMPLANT
DEVICE SUTURE ENDOST 10MM (ENDOMECHANICALS) ×2 IMPLANT
DRAIN PENROSE 18X1/4 LTX STRL (WOUND CARE) ×2 IMPLANT
ELECT REM PT RETURN 15FT ADLT (MISCELLANEOUS) ×2 IMPLANT
GAUZE SPONGE 4X4 12PLY STRL (GAUZE/BANDAGES/DRESSINGS) IMPLANT
GAUZE SPONGE 4X4 16PLY XRAY LF (GAUZE/BANDAGES/DRESSINGS) ×2 IMPLANT
GLOVE BIO SURGEON STRL SZ7.5 (GLOVE) IMPLANT
GLOVE INDICATOR 8.0 STRL GRN (GLOVE) ×2 IMPLANT
GOWN STRL REUS W/TWL XL LVL3 (GOWN DISPOSABLE) ×8 IMPLANT
HOVERMATT SINGLE USE (MISCELLANEOUS) ×2 IMPLANT
KIT BASIN OR (CUSTOM PROCEDURE TRAY) ×2 IMPLANT
KIT GASTRIC LAVAGE 34FR ADT (SET/KITS/TRAYS/PACK) ×2 IMPLANT
LUBRICANT JELLY K Y 4OZ (MISCELLANEOUS) ×2 IMPLANT
MARKER SKIN DUAL TIP RULER LAB (MISCELLANEOUS) ×2 IMPLANT
NDL SPNL 22GX3.5 QUINCKE BK (NEEDLE) ×1 IMPLANT
NEEDLE SPNL 22GX3.5 QUINCKE BK (NEEDLE) ×2 IMPLANT
PACK CARDIOVASCULAR III (CUSTOM PROCEDURE TRAY) ×2 IMPLANT
RELOAD 45 VASCULAR/THIN (ENDOMECHANICALS) IMPLANT
RELOAD ENDO STITCH 2.0 (ENDOMECHANICALS) ×16
RELOAD STAPLE 45 2.5 WHT GRN (ENDOMECHANICALS) IMPLANT
RELOAD STAPLE 45 3.5 BLU ETS (ENDOMECHANICALS) IMPLANT
RELOAD STAPLE 60 2.6 WHT THN (STAPLE) ×2 IMPLANT
RELOAD STAPLE 60 3.6 BLU REG (STAPLE) ×2 IMPLANT
RELOAD STAPLE 60 3.8 GOLD REG (STAPLE) ×1 IMPLANT
RELOAD STAPLE TA45 3.5 REG BLU (ENDOMECHANICALS) IMPLANT
RELOAD STAPLER BLUE 60MM (STAPLE) ×4 IMPLANT
RELOAD STAPLER GOLD 60MM (STAPLE) ×1 IMPLANT
RELOAD STAPLER WHITE 60MM (STAPLE) ×2 IMPLANT
RELOAD SUT SNGL STCH ABSRB 2-0 (ENDOMECHANICALS) ×4 IMPLANT
RELOAD SUT SNGL STCH BLK 2-0 (ENDOMECHANICALS) ×4 IMPLANT
SCISSORS LAP 5X45 EPIX DISP (ENDOMECHANICALS) ×2 IMPLANT
SET IRRIG TUBING LAPAROSCOPIC (IRRIGATION / IRRIGATOR) ×1 IMPLANT
SHEARS HARMONIC ACE PLUS 45CM (MISCELLANEOUS) ×2 IMPLANT
SLEEVE ADV FIXATION 12X100MM (TROCAR) ×4 IMPLANT
SLEEVE XCEL OPT CAN 5 100 (ENDOMECHANICALS) IMPLANT
SOLUTION ANTI FOG 6CC (MISCELLANEOUS) ×2 IMPLANT
STAPLER ECHELON BIOABSB 60 FLE (MISCELLANEOUS) IMPLANT
STAPLER ECHELON LONG 60 440 (INSTRUMENTS) ×2 IMPLANT
STAPLER RELOAD BLUE 60MM (STAPLE) ×8
STAPLER RELOAD GOLD 60MM (STAPLE) ×2
STAPLER RELOAD WHITE 60MM (STAPLE) ×4
STRIP CLOSURE SKIN 1/2X4 (GAUZE/BANDAGES/DRESSINGS) ×1 IMPLANT
SUT MNCRL AB 4-0 PS2 18 (SUTURE) ×2 IMPLANT
SUT RELOAD ENDO STITCH 2 48X1 (ENDOMECHANICALS) ×4
SUT RELOAD ENDO STITCH 2.0 (ENDOMECHANICALS) ×4
SUT SURGIDAC NAB ES-9 0 48 120 (SUTURE) IMPLANT
SUT VIC AB 2-0 SH 27 (SUTURE) ×2
SUT VIC AB 2-0 SH 27X BRD (SUTURE) ×1 IMPLANT
SUTURE RELOAD END STTCH 2 48X1 (ENDOMECHANICALS) ×4 IMPLANT
SUTURE RELOAD ENDO STITCH 2.0 (ENDOMECHANICALS) ×4 IMPLANT
SYR 10ML ECCENTRIC (SYRINGE) ×2 IMPLANT
SYR 20CC LL (SYRINGE) ×4 IMPLANT
TIP RIGID 35CM EVICEL (HEMOSTASIS) ×2 IMPLANT
TOWEL OR 17X26 10 PK STRL BLUE (TOWEL DISPOSABLE) ×2 IMPLANT
TOWEL OR NON WOVEN STRL DISP B (DISPOSABLE) ×2 IMPLANT
TRAY FOLEY W/METER SILVER 16FR (SET/KITS/TRAYS/PACK) ×2 IMPLANT
TROCAR ADV FIXATION 12X100MM (TROCAR) ×2 IMPLANT
TROCAR ADV FIXATION 5X100MM (TROCAR) ×2 IMPLANT
TROCAR BLADELESS OPT 5 100 (ENDOMECHANICALS) ×2 IMPLANT
TROCAR XCEL 12X100 BLDLESS (ENDOMECHANICALS) ×2 IMPLANT
TUBING CONNECTING 10 (TUBING) IMPLANT
TUBING ENDO SMARTCAP PENTAX (MISCELLANEOUS) ×2 IMPLANT
TUBING INSUF HEATED (TUBING) ×2 IMPLANT

## 2017-01-17 NOTE — Op Note (Signed)
Preoperative diagnosis: Roux-en-Y gastric bypass  Postoperative diagnosis: Same   Procedure: Upper endoscopy   Surgeon: Berna Buehelsea A Destynie Toomey, M.D.  Anesthesia: Gen.   Indications for procedure: This patient was undergoing a Roux-en-Y gastric bypass.   Description of procedure: The endoscopy was placed in the mouth and into the oropharynx and under endoscopic vision it was advanced to the esophagogastric junction. The pouch was insufflated and no bleeding or bubbles were seen. The GEJ was identified at 46cm from the teeth.  No bleeding or leaks were detected. The scope was withdrawn without difficulty.   Berna Buehelsea A Charly Holcomb, M.D. General, Bariatric, & Minimally Invasive Surgery [facility]

## 2017-01-17 NOTE — Anesthesia Procedure Notes (Addendum)
Procedure Name: Intubation Date/Time: 01/17/2017 7:37 AM Performed by: Deliah Boston Pre-anesthesia Checklist: Patient identified, Emergency Drugs available, Suction available and Patient being monitored Patient Re-evaluated:Patient Re-evaluated prior to inductionOxygen Delivery Method: Circle system utilized Preoxygenation: Pre-oxygenation with 100% oxygen Intubation Type: IV induction Ventilation: Mask ventilation without difficulty Laryngoscope Size: Glidescope and 4 Grade View: Grade II Tube type: Oral Tube size: 7.5 mm Number of attempts: 2 Airway Equipment and Method: Stylet,  Oral airway,  Video-laryngoscopy and Patient positioned with wedge pillow Placement Confirmation: ETT inserted through vocal cords under direct vision,  positive ETCO2 and breath sounds checked- equal and bilateral Secured at: 23 cm Tube secured with: Tape Dental Injury: Teeth and Oropharynx as per pre-operative assessment  Difficulty Due To: Difficulty was unanticipated, Difficult Airway- due to large tongue and Difficult Airway-  due to edematous airway Comments: Pt pre O2 with face mask and mask strap for 5 min.  Smooth IV induction,  Easy mask with 2 people. DVL x 1 with Mac 4, no view, redundant tissue and large tongue. Patient masked with 100% O2, DVL with glidescope mac 4 blade, grade 2 view. Atraumatic, oral intubation. Bil breath sounds, + and = bilaterally. ETT secured at 23 cm at the lip.

## 2017-01-17 NOTE — Transfer of Care (Signed)
Immediate Anesthesia Transfer of Care Note  Patient: [name]  Procedure(s) Performed: Procedure(s): LAPAROSCOPIC ROUX-EN-Y GASTRIC BYPASS WITH UPPER ENDOSCOPY (N/A)  Patient Location: PACU  Anesthesia Type:General  Level of Consciousness: Patient easily awoken, sedated, comfortable, cooperative, following commands, responds to stimulation.   Airway & Oxygen Therapy: Patient spontaneously breathing, ventilating well, oxygen via simple oxygen mask.  Post-op Assessment: Report given to PACU RN, vital signs reviewed and stable, moving all extremities.   Post vital signs: Reviewed and stable.  Complications: No apparent anesthesia complications  Last Vitals:  Vitals:   01/17/17 0556 01/17/17 1033  BP: 128/70 (P) 122/81  Pulse: 92 (!) (P) 101  Resp: 18 (P) 13  Temp: 36.8 C (P) 37.2 C    Last Pain:  Vitals:   01/17/17 0556  TempSrc: Oral      Patients Stated Pain Goal: 2 (01/17/17 [id number])  Complications: No apparent anesthesia complications

## 2017-01-17 NOTE — Op Note (Signed)
[name] [id number] Jul 20, 1981. 01/17/2017  Preoperative diagnosis:    Morbid obesity BMI 54.3   GERD   Hypercholesterolemia   OSA on CPAP Chronic low back pain   Postoperative  diagnosis:  1. same  Surgical procedure: Laparoscopic Roux-en-Y gastric bypass (ante-colic, ante-gastric); upper endoscopy  Surgeon: Atilano Ina, M.D. FACS  Asst.: Phylliss Blakes MD  Anesthesia: General plus Exparel  Complications: None   EBL: Minimal   Drains: None   Disposition: PACU in good condition   Indications for procedure: 36yo male with morbid obesity who has been unsuccessful at sustained weight loss. The patient's comorbidities are listed above. We discussed the risk and benefits of surgery including but not limited to anesthesia risk, bleeding, infection, blood clot formation, anastomotic leak, anastomotic stricture, ulcer formation, death, respiratory complications, intestinal blockage, internal hernia, gallstone formation, vitamin and nutritional deficiencies, injury to surrounding structures, failure to lose weight and mood changes.   Description of procedure: Patient is brought to the operating room and general anesthesia induced. The patient had received preoperative broad-spectrum IV antibiotics and subcutaneous heparin. The abdomen was widely sterilely prepped with Chloraprep and draped. Patient timeout was performed and correct patient and procedure confirmed. Access was obtained with a 12 mm Optiview trocar in the left upper quadrant and pneumoperitoneum established without difficulty. Under direct vision 12 mm trocars were placed laterally in the right upper quadrant, right upper quadrant midclavicular line, and to the left and above the umbilicus for the camera port. A 5 mm trocar was placed laterally in the left upper quadrant.  The omentum was brought into the upper abdomen and the transverse mesocolon elevated and the ligament of Treitz clearly identified. A 40 cm biliopancreatic  limb was then carefully measured from the ligament of Treitz. The small intestine was divided at this point with a single firing of the white load linear stapler. A Penrose drain was sutured to the end of the Roux-en-Y limb for later identification. A 100 cm Roux-en-Y limb was then carefully measured. At this point a side-to-side anastomosis was created between the Roux limb and the end of the biliopancreatic limb. This was accomplished with a single firing of the 60 mm white load linear stapler. The common enterotomy was closed with a running 2-0 Vicryl begun at either end of the enterotomy and tied centrally. Eviceal tissue sealant was placed over the anastomosis. The mesenteric defect was then closed with running 2-0 silk. The omentum was then divided with the harmonic scalpel up towards the transverse colon to allow mobility of the Roux limb toward the gastric pouch. The patient was then placed in steep reversed Trendelenburg. Through a 5 mm subxiphoid site the [facility] retractor was placed and the left lobe of the liver elevated with excellent exposure of the upper stomach and hiatus. The angle of Hiss was then mobilized with the harmonic scalpel. A 4 cm gastric pouch was then carefully measured along the lesser curve of the stomach. Dissection was carried along the lesser curve at this point with the Harmonic scalpel working carefully back toward the lesser sac at right angles to the lesser curve. The free lesser sac was then entered. After being sure all tubes were removed from the stomach an initial firing of the gold load 60 mm linear stapler was fired at right angles across the lesser curve for about 4 cm. The gastric pouch was further mobilized posteriorly and then the pouch was completed with 3 further firings of the 60 mm blue load linear  stapler up through the previously dissected angle of His. It was ensured that the pouch was completely mobilized away from the gastric remnant. This created a nice  tubular 4-5 cm gastric pouch. The Roux limb was then brought up in an antecolic fashion with the candycane facing to the patient's left without undue tension. The gastrojejunostomy was created with an initial posterior row of 2-0 Vicryl between the Roux limb and the staple line of the gastric pouch. Enterotomies were then made in the gastric pouch and the Roux limb with the harmonic scalpel and at approximately 2-2-1/2 cm anastomosis was created with a single firing of the 60mm blue load linear stapler. The staple line was inspected and was intact without bleeding. The common enterotomy was then closed with running 2-0 Vicryl begun at either end and tied centrally. The Ewall tube was then easily passed through the anastomosis and an outer anterior layer of running 2-0 Vicryl was placed. The Ewald tube was removed. With the outlet of the gastrojejunostomy clamped and under saline irrigation the assistant performed upper endoscopy and with the gastric pouch tensely distended with air-there was no evidence of leak on this test. The pouch was desufflated. The Vonita Mosseterson defect was closed with running 2-0 silk. The abdomen was inspected for any evidence of bleeding or bowel injury and everything looked fine. The Nathanson retractor was removed under direct vision after coating the anastomosis with Eviceal tissue sealant. All CO2 was evacuated and trochars removed. Skin incisions were closed with 4-0 monocryl in a subcuticular fashion followed by benzoin, steri-strips, and bandages. Sponge needle and instrument counts were correct. The patient was taken to the PACU in good condition.    Mary SellaEric M. Andrey CampanileWilson, MD, FACS General, Bariatric, & Minimally Invasive Surgery [facility], [place]

## 2017-01-17 NOTE — Interval H&P Note (Signed)
History and Physical Interval Note:  01/17/2017 7:10 AM  [name]  has presented today for surgery, with the diagnosis of Morbid Obesity, GERD, Hypercholesterolemia, Gallbladder Polyp, OSA, back pain  The various methods of treatment have been discussed with the patient and family. After consideration of risks, benefits and other options for treatment, the patient has consented to  Procedure(s): LAPAROSCOPIC ROUX-EN-Y GASTRIC BYPASS WITH UPPER ENDOSCOPY (N/A) as a surgical intervention .  The patient's history has been reviewed, patient examined, no change in status, stable for surgery.  I have reviewed the patient's chart and labs.  Questions were answered to the patient's satisfaction.    [name]. Jesse CampanileWilson, MD, FACS General, Bariatric, & Minimally Invasive Surgery [facility], [place]  [facility]anoWILSON,[name]

## 2017-01-17 NOTE — Anesthesia Postprocedure Evaluation (Signed)
Anesthesia Post Note  Patient: Jaynie BreamLeroy D Haber  Procedure(s) Performed: Procedure(s) (LRB): LAPAROSCOPIC ROUX-EN-Y GASTRIC BYPASS WITH UPPER ENDOSCOPY (N/A)     Patient location during evaluation: PACU Anesthesia Type: General Level of consciousness: awake and alert Pain management: pain level controlled Vital Signs Assessment: post-procedure vital signs reviewed and stable Respiratory status: spontaneous breathing, nonlabored ventilation, respiratory function stable and patient connected to nasal cannula oxygen Cardiovascular status: blood pressure returned to baseline and stable Postop Assessment: no signs of nausea or vomiting Anesthetic complications: no    Last Vitals:  Vitals:   01/17/17 1130 01/17/17 1145  BP: 128/81   Pulse: 99 99  Resp: 13 13  Temp: 37.2 C     Last Pain:  Vitals:   01/17/17 1130  TempSrc:   PainSc: 9                  Sheika Coutts,W. EDMOND

## 2017-01-17 NOTE — Progress Notes (Signed)
RT placed patient on CPAP. Patient home setting is 10-20 cmH2O with 3 liters oxygen bleed into tubing. Sterile water added to water chamber for humidification. Patient is tolerating well.

## 2017-01-17 NOTE — H&P (Signed)
[name] 12/24/2016 9:59 AM Location: [facility] Patient #: [id number] DOB: 1981-03-20 Married / Language: English / Race: Black or African American Male   History of Present Illness [name] MD; 12/24/2016 10:02 AM) The patient is a 36 year old male who presents for a bariatric surgery evaluation. He comes in for his preoperative appointment. He has been approved for laparoscopic Roux-en-Y gastric bypass. He denies any significant medical changes since I initially met him. He did end up in the emergency room back in March with right-sided pain. He underwent ultrasound and CT imaging. It appeared that he had musculoskeletal strain. There was no evidence of cholelithiasis or intra-abdominal pathology. And in talking with him about his symptoms it was more consistent with a pulled muscle. His blood work in the emergency room consisting of a comprehensive metabolic panel, CBC, and lipase were also within normal limits. He denies any chest pain, chest pressure, shortness of breath, paroxysmal nocturnal dyspnea, orthopnea. He is still using his CPAP. He still has some reflux issues but is currently not taking any medications. He has been eliminating foods that worsen his reflux and takes cough drops as needed. He denies any melena or hematochezia and any hematuria.  Chest x-ray and upper GI were within normal limits. No hiatal hernia.  Review of systems-a comprehensive 12 point review of systems was negative except for what is mentioned in HPI  08/27/16 He is referred by Dr Seward Carol for evaluation of weight loss surgery. He is accompanied by his wife. He attended the seminar in person. He is interested in the gastric bypass. He has struggled with his weight since his mid 59s. Despite numerous attempts for sustained weight loss he has been unsuccessful. He has tried Atkins, [employer], Herbalife, fat burners-all without any long-term success.  His  comorbidities include dyslipidemia, obstructive sleep apnea on CPAP, gastroesophageal reflux disease  He denies any chest pain, chest pressure, shortness of breath, orthopnea, paroxysmal nocturnal dyspnea. He may get a little bit short of breath with physical activity. He denies any prior blood clots. He denies any [address] family members with blood clots. Does use CPAP. He does have a history of esophagitis. His reflux is intermittent. He takes Nexium as needed. He did have an issue with esophageal ulcers in 2013 as demonstrated on an upper endoscopy. He has a bowel movement daily. Denies any prior abdominal surgery. Denies any melena or hematochezia. Denies any dysuria or hematuria. He has Center chronic low back pain. He denies any TIAs or amaurosis fugax. He denies any tobacco use. He drinks alcohol on a very rare occasion. Denies any drug use. He is a Company secretary.   Problem List/Past Medical [name], MD; 12/24/2016 10:05 AM) PRE-OP TESTING (Z01.818)  OSA ON CPAP (G47.33)  GALLBLADDER POLYP (K82.4)  CHRONIC MIDLINE LOW BACK PAIN WITH RIGHT-SIDED SCIATICA (M54.41)  GASTROESOPHAGEAL REFLUX DISEASE, ESOPHAGITIS PRESENCE NOT SPECIFIED (K21.9)  HYPERLIPIDEMIA, ACQUIRED (E78.5)   Past Surgical History [name], MD; 12/24/2016 10:05 AM) Oral Surgery   Diagnostic Studies History [name]. Redmond Pulling, MD; 12/24/2016 10:05 AM) Colonoscopy  never  Allergies [name], MD; 12/24/2016 10:05 AM) Amoxicillin *PENICILLINS*  Allergies Reconciled   Medication History [name], MD; 12/24/2016 10:05 AM) OxyCODONE HCl (5MG/5ML Solution, 5-10 Milliliter Oral every four hours, as needed, Taken starting 12/24/2016) Active. Ondansetron (4MG Tablet Disint, 1 (one) Tablet Oral every six hours, as needed, Taken starting 12/24/2016) Active. Cetirizine HCl (10MG Tablet, Oral) Active.  Social History [name], MD; 12/24/2016 10:05 AM) Alcohol use  Occasional  alcohol use. Caffeine use  Carbonated beverages, Coffee, Tea. No drug use  Tobacco use  Never smoker.  Family History [name], MD; 12/24/2016 10:05 AM) Arthritis  Father. Breast Cancer  Family Members In General, Mother. Colon Polyps  Father. Hypertension  Father.  Other Problems [name], MD; 12/24/2016 10:05 AM) Gastroesophageal Reflux Disease  Hypercholesterolemia  Inguinal Hernia  Sleep Apnea  OBESITY, MORBID, BMI 40.0-49.9 (E66.01)     Physical Exam [name] M. Chaia Ikard MD; 12/24/2016 10:04 AM) General Mental Status-Alert. General Appearance-Consistent with stated age. Hydration-Well hydrated. Voice-Normal. Note: central truncal obesity   Head and Neck Head-normocephalic, atraumatic with no lesions or palpable masses. Trachea-midline. Thyroid Gland Characteristics - normal size and consistency.  Eye Eyeball - Bilateral-Extraocular movements intact. Sclera/Conjunctiva - Bilateral-No scleral icterus.  ENMT Note: normal external ears lips intact   Chest and Lung Exam Chest and lung exam reveals -quiet, even and easy respiratory effort with no use of accessory muscles and on auscultation, normal breath sounds, no adventitious sounds and normal vocal resonance. Inspection Chest Wall - Normal. Back - normal.  Breast - Did not examine.  Cardiovascular Cardiovascular examination reveals -normal heart sounds, regular rate and rhythm with no murmurs and normal pedal pulses bilaterally.  Abdomen Inspection Inspection of the abdomen reveals - No Hernias. Skin - Scar - no surgical scars. Palpation/Percussion Palpation and Percussion of the abdomen reveal - Soft, Non Tender, No Rebound tenderness, No Rigidity (guarding) and No hepatosplenomegaly. Auscultation Auscultation of the abdomen reveals - Bowel sounds normal.  Peripheral Vascular Upper Extremity Palpation - Pulses bilaterally normal.  Neurologic Neurologic  evaluation reveals -alert and oriented x 3 with no impairment of recent or remote memory. Mental Status-Normal.  Neuropsychiatric The patient's mood and affect are described as -normal. Judgment and Insight-insight is appropriate concerning matters relevant to self.  Musculoskeletal Normal Exam - Left-Upper Extremity Strength Normal and Lower Extremity Strength Normal. Normal Exam - Right-Upper Extremity Strength Normal and Lower Extremity Strength Normal.  Lymphatic Head & Neck  General Head & Neck Lymphatics: Bilateral - Description - Normal. Axillary - Did not examine. Femoral & Inguinal - Did not examine.    Assessment & Plan [name] M. Linsay Vogt MD; 12/24/2016 10:04 AM) MORBID OBESITY WITH BMI OF 50.0-59.9, ADULT (E66.01) Impression: We reviewed his preoperative workup discussed the findings of his upper GI. The patient was under the impression that he had a hiatal hernia. His upper endoscopy from 2013 mentioned no evidence of a hiatal hernia nor did his upper GI. However we did talk about that in some instances the upper GI can miss certain findings. I did assure him that if we did come across a hiatal hernia intraoperatively that we would repair it. We rediscussed the typical hospitalization as well as the typical postoperative course. We rediscussed the big important complications that can occur. He was given his postoperative prescriptions for pain and nausea. We discussed the importance of the preoperative diet. All of his questions were asked and answered. Current Plans Pt Education - EMW_preopbariatric OSA ON CPAP (G47.33) GASTROESOPHAGEAL REFLUX DISEASE, ESOPHAGITIS PRESENCE NOT SPECIFIED (K21.9) HYPERLIPIDEMIA, ACQUIRED (E78.5) CHRONIC MIDLINE LOW BACK PAIN WITH RIGHT-SIDED SCIATICA (M54.41)  [name]. Redmond Pulling, MD, FACS General, Bariatric, & Minimally Invasive Surgery [facility], [place]

## 2017-01-18 ENCOUNTER — Encounter (HOSPITAL_COMMUNITY): Payer: Self-pay | Admitting: General Surgery

## 2017-01-18 LAB — COMPREHENSIVE METABOLIC PANEL
ALT: 184 U/L — ABNORMAL HIGH (ref 17–63)
ANION GAP: 9 (ref 5–15)
AST: 112 U/L — ABNORMAL HIGH (ref 15–41)
Albumin: 3.5 g/dL (ref 3.5–5.0)
Alkaline Phosphatase: 78 U/L (ref 38–126)
BILIRUBIN TOTAL: 0.3 mg/dL (ref 0.3–1.2)
BUN: 10 mg/dL (ref 6–20)
CALCIUM: 9 mg/dL (ref 8.9–10.3)
CO2: 28 mmol/L (ref 22–32)
Chloride: 99 mmol/L — ABNORMAL LOW (ref 101–111)
Creatinine, Ser: 1.02 mg/dL (ref 0.61–1.24)
Glucose, Bld: 117 mg/dL — ABNORMAL HIGH (ref 65–99)
Potassium: 3.8 mmol/L (ref 3.5–5.1)
Sodium: 136 mmol/L (ref 135–145)
TOTAL PROTEIN: 7.3 g/dL (ref 6.5–8.1)

## 2017-01-18 LAB — CBC WITH DIFFERENTIAL/PLATELET
Basophils Absolute: 0 10*3/uL (ref 0.0–0.1)
Basophils Relative: 0 %
Eosinophils Absolute: 0 10*3/uL (ref 0.0–0.7)
Eosinophils Relative: 0 %
HEMATOCRIT: 37.8 % — AB (ref 39.0–52.0)
HEMOGLOBIN: 12.5 g/dL — AB (ref 13.0–17.0)
LYMPHS ABS: 1.7 10*3/uL (ref 0.7–4.0)
Lymphocytes Relative: 14 %
MCH: 27.4 pg (ref 26.0–34.0)
MCHC: 33.1 g/dL (ref 30.0–36.0)
MCV: 82.7 fL (ref 78.0–100.0)
Monocytes Absolute: 1 10*3/uL (ref 0.1–1.0)
Monocytes Relative: 8 %
NEUTROS ABS: 9.5 10*3/uL — AB (ref 1.7–7.7)
NEUTROS PCT: 78 %
Platelets: 258 10*3/uL (ref 150–400)
RBC: 4.57 MIL/uL (ref 4.22–5.81)
RDW: 13.8 % (ref 11.5–15.5)
WBC: 12.2 10*3/uL — ABNORMAL HIGH (ref 4.0–10.5)

## 2017-01-18 LAB — HEMOGLOBIN AND HEMATOCRIT, BLOOD
HEMATOCRIT: 38 % — AB (ref 39.0–52.0)
Hemoglobin: 12.5 g/dL — ABNORMAL LOW (ref 13.0–17.0)

## 2017-01-18 MED ORDER — SIMETHICONE 40 MG/0.6ML PO SUSP
40.0000 mg | Freq: Four times a day (QID) | ORAL | Status: DC | PRN
  Administered 2017-01-18 (×2): 40 mg via ORAL
  Filled 2017-01-18 (×2): qty 0.6

## 2017-01-18 NOTE — Care Management Note (Signed)
Case Management Note  Patient Details  Name: [name] MRN: [id number] Date of Birth: 08/10/1981  Subjective/Objective:                  Preoperative diagnosis:    Morbid obesity BMI 54.3   GERD   Hypercholesterolemia   OSA on CPAP Chronic low back pain   Postoperative  diagnosis:  1. same  Surgical procedure: Laparoscopic Roux-en-Y gastric bypass (ante-colic, ante-gastric); upper endoscopy  Action/Plan:  Date:  January 18, 2017  Chart reviewed for concurrent status and case management needs.  Will continue to follow patient progress.  Discharge Planning: following for needs  Expected discharge date: [id number]  Marcelle SmilingRhonda Davis, BSN, [place]RN3, [place]   [phone]   Expected Discharge Date:                  Expected Discharge Plan:  Home/Self Care  In-House Referral:     Discharge planning Services  CM Consult  Post Acute Care Choice:    Choice offered to:     DME Arranged:    DME Agency:     HH Arranged:    HH Agency:     Status of Service:  In process, will continue to follow  If discussed at Long Length of Stay Meetings, dates discussed:    Additional Comments:  Golda AcreDavis, Rhonda Lynn, RN 01/18/2017, 9:14 AM

## 2017-01-18 NOTE — Progress Notes (Signed)
Patient ID: [name], male   DOB: Jul 29, 1981, 36 y.o.   MRN: [id number]  Progress Note: Metabolic and Bariatric Surgery Service   Chief Complaint/Subjective: Fair amount of cramps- epigastric & b/l upper abd discomfort; not much nausea/ ambulated several times.   Objective: Vital signs in last 24 hours: Temp:  [98.2 F (36.8 C)-99.2 F (37.3 C)] 98.7 F (37.1 C) (06/05 0619) Pulse Rate:  [92-103] 92 (06/05 0734) Resp:  [13-21] 20 (06/05 0619) BP: (115-151)/(66-114) 130/67 (06/05 0734) SpO2:  [94 %-100 %] 99 % (06/05 0619)    Intake/Output from previous day: 06/04 0701 - 06/05 0700 In: 4100 [P.O.:150; I.V.:3950] Out: 385 [Urine:375; Blood:10] Intake/Output this shift: Total I/O In: 30 [P.O.:30] Out: 825 [Urine:825]  Lungs: cta b/l  Cardiovascular: reg  Abd: obese, soft, incisions c/d/i; approp mild ttp  Extremities: +scds, no edema  Neuro: approp  Lab Results: CBC   Recent Labs  01/17/17 1108 01/18/17 0638  WBC  --  12.2*  HGB 12.4* 12.5*  HCT 37.6* 37.8*  PLT  --  258   BMET  Recent Labs  01/18/17 0638  NA 136  K 3.8  CL 99*  CO2 28  GLUCOSE 117*  BUN 10  CREATININE 1.02  CALCIUM 9.0   PT/INR No results for input(s): LABPROT, INR in the last 72 hours. ABG No results for input(s): PHART, HCO3 in the last 72 hours.  Invalid input(s): PCO2, PO2  Studies/Results:  Anti-infectives: Anti-infectives    Start     Dose/Rate Route Frequency Ordered Stop   01/17/17 0710  levofloxacin (LEVAQUIN) 750 MG/150ML IVPB    Comments:  Randon GoldsmithJones, Rachel   : cabinet override      01/17/17 0710 01/17/17 0743   01/17/17 0604  levofloxacin (LEVAQUIN) IVPB 750 mg     750 mg 100 mL/hr over 90 Minutes Intravenous On call to O.R. 01/17/17 0604 01/17/17 0743      Medications: Scheduled Meds: . enoxaparin (LOVENOX) injection  30 mg Subcutaneous Q12H  . pantoprazole (PROTONIX) IV  40 mg Intravenous QHS  . [START ON 01/19/2017] protein supplement shake  2 oz Oral  Q2H  . scopolamine  1 patch Transdermal On Call to OR   Continuous Infusions: . dextrose 5 % and 0.45 % NaCl with KCl 20 mEq/L 1,000 mL (01/18/17 0033)   PRN Meds:.oxyCODONE **AND** acetaminophen, acetaminophen, albuterol, diphenhydrAMINE, morphine injection, ondansetron (ZOFRAN) IV, promethazine  Assessment/Plan: Patient Active Problem List   Diagnosis Date Noted  . OSA on CPAP 01/17/2017  . Morbid obesity (HCC) 01/17/2017  . Hypercholesterolemia   . GASTRITIS 12/18/2008  . GERD 12/09/2008  . NAUSEA WITH VOMITING 12/09/2008   s/p Procedure(s): LAPAROSCOPIC ROUX-EN-Y GASTRIC BYPASS WITH UPPER ENDOSCOPY 01/17/2017  Doing well. Cramps & b/l upper abd cramps as expected Cont water, adv diet per protocol Add simethicone Cont chemical vte prophylaxis Ambulate   Disposition:  LOS: 1 day  The patient will be in the hospital for normal postop protocol  Atilano InaWILSON,Keondria Siever M, MD [phone] [facility]

## 2017-01-18 NOTE — Consult Note (Signed)
   [facility] Consult   01/18/2017  Jaynie BreamLeroy D Harms 1981/02/22 [id number]    Came to visit on behalf of [facility] Care Management/Link to Wellness program for [employer] employees/dependents with [facility] [employer] insurance. Before entering room, nursing student reports patient is washing up. Will come back at later time.    Raiford NobleAtika Kodi Guerrera, MSN-Ed, RN,BSN [facility] Care Management Hospital Liaison [phone]

## 2017-01-18 NOTE — Progress Notes (Signed)
Nutrition Education Note  Received consult for diet education per DROP protocol.   Discussed 2 week post op diet with pt. Emphasized that liquids must be non carbonated, non caffeinated, and sugar free. Fluid goals discussed. Pt to follow up with outpatient bariatric RD for further diet progression after 2 weeks. Multivitamins and minerals also reviewed. Teach back method used, pt expressed understanding, expect good compliance.  Pt currently unable to tolerate protein and has just drank clear liquids today. Pt has chosen Chartered certified accountant as his protein supplement. Pt ambulating in hall this morning and reports a lot of pain and no appetite. Pt is planning to attend education class in two weeks for follow-up. Pt reports that he feels good moving forward for the next two weeks. Pt already has multivitamins.   Diet: First 2 Weeks  You will see the nutritionist about two (2) weeks after your surgery. The nutritionist will increase the types of foods you can eat if you are handling liquids well:  If you have severe vomiting or nausea and cannot handle clear liquids lasting longer than 1 day, call your surgeon  Protein Shake  Drink at least 2 ounces of shake 5-6 times per day  Each serving of protein shakes (usually 8 - 12 ounces) should have a minimum of:  15 grams of protein  And no more than 5 grams of carbohydrate  Goal for protein each day:  Men = 80 grams per day  Women = 60 grams per day  Protein powder may be added to fluids such as non-fat milk or Lactaid milk or Soy milk (limit to 35 grams added protein powder per serving)   Hydration  Slowly increase the amount of water and other clear liquids as tolerated (See Acceptable Fluids)  Slowly increase the amount of protein shake as tolerated  Sip fluids slowly and throughout the day  May use sugar substitutes in small amounts (no more than 6 - 8 packets per day; i.e. Splenda)   Fluid Goal  The first goal is to drink at least 8 ounces of  protein shake/drink per day (or as directed by the nutritionist); some examples of protein shakes are Premier Protein, [employer], [employer], EAS Edge HP, and Unjury. See handout from pre-op Bariatric Education Class:  Slowly increase the amount of protein shake you drink as tolerated  You may find it easier to slowly sip shakes throughout the day  It is important to get your proteins in first  Your fluid goal is to drink 64 - 100 ounces of fluid daily  It may take a few weeks to build up to this  32 oz (or more) should be clear liquids  And  32 oz (or more) should be full liquids (see below for examples)  Liquids should not contain sugar, caffeine, or carbonation   Clear Liquids:  Water or Sugar-free flavored water (i.e. Fruit H2O, Propel)  Decaffeinated coffee or tea (sugar-free)  Crystal Lite, Wyler's Lite, Minute Maid Lite  Sugar-free Jell-O  Bouillon or broth  Sugar-free Popsicle: *Less than 20 calories each; Limit 1 per day   Full Liquids:  Protein Shakes/Drinks + 2 choices per day of other full liquids  Full liquids must be:  No More Than 12 grams of Carbs per serving  No More Than 3 grams of Fat per serving  Strained low-fat cream soup  Non-Fat milk  Fat-free Lactaid Milk  Sugar-free yogurt (Dannon Lite & Fit, [place] yogurt, Oikos Zero)   [employer] MS,  RD, LDN Pager #- [phone]

## 2017-01-18 NOTE — Progress Notes (Signed)
Patient alert and oriented, Post op day 1.  Provided support and encouragement.  Encouraged pulmonary toilet, ambulation and small sips of liquids.  All questions answered.  Will continue to monitor. 

## 2017-01-19 LAB — CBC WITH DIFFERENTIAL/PLATELET
Basophils Absolute: 0 10*3/uL (ref 0.0–0.1)
Basophils Relative: 1 %
EOS ABS: 0 10*3/uL (ref 0.0–0.7)
EOS PCT: 0 %
HCT: 37.6 % — ABNORMAL LOW (ref 39.0–52.0)
Hemoglobin: 12.3 g/dL — ABNORMAL LOW (ref 13.0–17.0)
LYMPHS ABS: 1.6 10*3/uL (ref 0.7–4.0)
LYMPHS PCT: 19 %
MCH: 27.2 pg (ref 26.0–34.0)
MCHC: 32.7 g/dL (ref 30.0–36.0)
MCV: 83.2 fL (ref 78.0–100.0)
MONO ABS: 0.8 10*3/uL (ref 0.1–1.0)
MONOS PCT: 9 %
Neutro Abs: 6 10*3/uL (ref 1.7–7.7)
Neutrophils Relative %: 71 %
PLATELETS: 220 10*3/uL (ref 150–400)
RBC: 4.52 MIL/uL (ref 4.22–5.81)
RDW: 13.9 % (ref 11.5–15.5)
WBC: 8.5 10*3/uL (ref 4.0–10.5)

## 2017-01-19 MED ORDER — KETOROLAC TROMETHAMINE 15 MG/ML IJ SOLN
15.0000 mg | Freq: Once | INTRAMUSCULAR | Status: AC
  Administered 2017-01-19: 15 mg via INTRAVENOUS
  Filled 2017-01-19: qty 1

## 2017-01-19 MED ORDER — KETOROLAC TROMETHAMINE 15 MG/ML IJ SOLN
15.0000 mg | Freq: Two times a day (BID) | INTRAMUSCULAR | Status: DC | PRN
  Filled 2017-01-19: qty 1

## 2017-01-19 NOTE — Progress Notes (Signed)
Patient alert and oriented, Post op day 2.  Provided support and encouragement.  Encouraged pulmonary toilet, ambulation and small sips of liquids.  Discussed pain options and goals for patient.  IV Tordal added by MD x 1 dose.  Goals set of 12 ounces clear fluid along with shake.  Patient and wife state understanding.  All questions answered.  Will continue to monitor.

## 2017-01-19 NOTE — Progress Notes (Signed)
Patient ID: Jesse BreamLeroy D Goral, male   DOB: 1981-08-02, 36 y.o.   MRN: [id number]  Progress Note: Metabolic and Bariatric Surgery Service   Chief Complaint/Subjective: Reporting a fair amount of b/l upper abd discomfort, discomfort occurs with taking deep breaths, getting up out of bed, took in more water than shake yesterday. No emesis; ambulated several times. [facility]  Objective: Vital signs in last 24 hours: Temp:  [98.6 F (37 C)-99.2 F (37.3 C)] 99.2 F (37.3 C) (06/06 0845) Pulse Rate:  [86-96] 92 (06/06 0845) Resp:  [18] 18 (06/06 0845) BP: (111-126)/(55-80) 116/59 (06/06 0845) SpO2:  [94 %-100 %] 97 % (06/06 0845) Weight:  [157.3 kg (346 lb 12.5 oz)] 157.3 kg (346 lb 12.5 oz) (06/05 1453) Last BM Date: 01/16/17  Intake/Output from previous day: 06/05 0701 - 06/06 0700 In: 2931.7 [P.O.:290; I.V.:2641.7] Out: 2575 [Urine:2575] Intake/Output this shift: Total I/O In: 60 [P.O.:60] Out: -   Lungs: cta, symmetric, nonlabored  Cardiovascular: reg  Abd: obese, soft, incisions c/d/i; min TTP  Extremities: no edema, +scds  Neuro: approp, nonfocal  Lab Results: CBC   Recent Labs  01/18/17 0638 01/18/17 1552 01/19/17 0601  WBC 12.2*  --  8.5  HGB 12.5* 12.5* 12.3*  HCT 37.8* 38.0* 37.6*  PLT 258  --  220   BMET  Recent Labs  01/18/17 0638  NA 136  K 3.8  CL 99*  CO2 28  GLUCOSE 117*  BUN 10  CREATININE 1.02  CALCIUM 9.0   PT/INR No results for input(s): LABPROT, INR in the last 72 hours. ABG No results for input(s): PHART, HCO3 in the last 72 hours.  Invalid input(s): PCO2, PO2  Studies/Results:  Anti-infectives: Anti-infectives    Start     Dose/Rate Route Frequency Ordered Stop   01/17/17 0710  levofloxacin (LEVAQUIN) 750 MG/150ML IVPB    Comments:  Randon GoldsmithJones, Rachel   : cabinet override      01/17/17 0710 01/17/17 0743   01/17/17 0604  levofloxacin (LEVAQUIN) IVPB 750 mg     750 mg 100 mL/hr over 90 Minutes Intravenous On call to O.R.  01/17/17 0604 01/17/17 0743      Medications: Scheduled Meds: . enoxaparin (LOVENOX) injection  30 mg Subcutaneous Q12H  . pantoprazole (PROTONIX) IV  40 mg Intravenous QHS  . protein supplement shake  2 oz Oral Q2H  . scopolamine  1 patch Transdermal On Call to OR   Continuous Infusions: . dextrose 5 % and 0.45 % NaCl with KCl 20 mEq/L 125 mL/hr at 01/19/17 1045   PRN Meds:.oxyCODONE **AND** acetaminophen, acetaminophen, albuterol, diphenhydrAMINE, morphine injection, ondansetron (ZOFRAN) IV, promethazine, simethicone  Assessment/Plan: Patient Active Problem List   Diagnosis Date Noted  . OSA on CPAP 01/17/2017  . Morbid obesity (HCC) 01/17/2017  . Hypercholesterolemia   . GASTRITIS 12/18/2008  . GERD 12/09/2008  . NAUSEA WITH VOMITING 12/09/2008   s/p Procedure(s): LAPAROSCOPIC ROUX-EN-Y GASTRIC BYPASS WITH UPPER ENDOSCOPY 01/17/2017  Principal Problem:   Morbid obesity (HCC) Active Problems:   GERD   Hypercholesterolemia   OSA on CPAP  Cont cpap No fever, no tachycardia, wbc normal. Discomfort seems more mskl, will try a dose of toradol.  Cont diet as tolerated.  Ambulated Cont chemical vte prophylaxis  Mary SellaEric M. Andrey CampanileWilson, MD, FACS General, Bariatric, & Minimally Invasive Surgery [facility]  Disposition:  LOS: 2 days  The patient does not meet criteria for discharge because He did not meet oral intake goals and is at  high risk of dehydration  Atilano Ina, MD [phone] [facility]

## 2017-01-19 NOTE — Consult Note (Signed)
   [facility]   01/19/2017  Jaynie BreamLeroy D Moccia Apr 12, 1981 [id number]    [facility] to Wellness [facility] up.  Went to bedside to discuss Link to [employer] program for [employer] employees/dependents with [employer]. Wife is a Producer, [employer] employee. Bedside conversation was brief. Provided Link to [employer] brochure and contact information and 24-hr nurse line magnet. Discussed that he will receive post hospital discharge call.    Raiford NobleAtika Reshard Guillet, MSN-Ed, RN,BSN [facility] Care [facility] Hospital Liaison [phone]

## 2017-01-19 NOTE — Progress Notes (Signed)
Dr Andrey CampanileWilson made aware patient slow progression with fluid intake and pain control.  Orders placed.  Patient encouraged to continue fluid intake and ambulation.  IS at bedside.  Wife at bedside aware of the above.

## 2017-01-19 NOTE — Progress Notes (Signed)
IV Tordal helped with pain management.  Patient up to wash up.  Continues to drink clear liquid fluids and is working on protein.  Wife at bedside

## 2017-01-20 MED ORDER — ENOXAPARIN (LOVENOX) PATIENT EDUCATION KIT
PACK | Freq: Once | Status: AC
  Administered 2017-01-20: 11:00:00
  Filled 2017-01-20: qty 1

## 2017-01-20 MED ORDER — ENOXAPARIN SODIUM 40 MG/0.4ML ~~LOC~~ SOLN: 40.0000 mg | SUBCUTANEOUS | 0 refills | Status: DC

## 2017-01-20 MED ORDER — OXYCODONE HCL 5 MG PO TABS: 5.0000 mg | ORAL_TABLET | ORAL | 0 refills | Status: DC | PRN

## 2017-01-20 MED ORDER — PANTOPRAZOLE SODIUM 40 MG PO TBEC: 40.0000 mg | DELAYED_RELEASE_TABLET | Freq: Every day | ORAL | 0 refills | Status: DC

## 2017-01-20 MED FILL — ONDANSETRON ODT 4 MG TABLET: 4 | 4 days supply | Qty: 15 | Fill #0

## 2017-01-20 MED FILL — PANTOPRAZOLE SOD DR 40 MG T: 40 | 30 days supply | Qty: 30 | Fill #0

## 2017-01-20 MED FILL — ENOXAPARIN 40 MG/0.4 ML SYR: 40 | 14 days supply | Qty: 6 | Fill #0

## 2017-01-20 NOTE — Progress Notes (Signed)
Patient alert and oriented, pain is controlled. Patient is tolerating fluids, advanced to protein shake today, patient is tolerating well. Reviewed Gastric Bypass discharge instructions with patient and patient is able to articulate understanding. Provided information on BELT program, Support Group and [facility] outpatient pharmacy. All questions answered, will continue to monitor.    

## 2017-01-20 NOTE — Progress Notes (Signed)
[id number]/Rhonda Davis,BSN,RN3,[phone]/Chart checked for dc orders and case management needs/none found patient is ready for discharge and self care at home.

## 2017-01-20 NOTE — Progress Notes (Signed)
Pt's wife administered the Lovenox shot and they received the Lovenox kit to take home.  D/C instructions were given and understanding was verbalized.

## 2017-01-20 NOTE — Discharge Instructions (Signed)
How and Where to Give Subcutaneous Enoxaparin Injections °Enoxaparin is an injectable medicine. It is used to help prevent blood clots from developing in your veins. Health care providers often use anticoagulants like enoxaparin to prevent clots following surgery. Enoxaparin is also used in combination with other medicines to treat blood clots and heart attacks. If blood clots are left untreated, they can be life threatening. °Enoxaparin comes in single-use syringes. You inject enoxaparin through a syringe into your belly (abdomen). You should change the injection site each time you give yourself a shot. Continue the enoxaparin injections as directed by your health care provider. Your health care provider will use blood clotting test results to decide when you can safely stop using enoxaparin injections. If your health care provider prescribes any additional medicines, use the medicines exactly as directed. °How do I inject enoxaparin? °1. Wash your hands with soap and water. °2. Clean the selected injection site as directed by your health care provider. °3. Remove the needle cap by pulling it straight off the syringe. °4. When using a prefilled syringe, do not push the air bubble out of the syringe before the injection. The air bubble will help you get all of the medicine out of the syringe. °5. Hold the syringe like a pencil using your writing hand. °6. Use your other hand to pinch and hold an inch of the cleansed skin. °7. Insert the entire needle straight down into the fold of skin. °8. Push the plunger with your thumb until the syringe is empty. °9. Pull the needle straight out of your skin. °10. Enoxaparin injection prefilled syringes and graduated prefilled syringes are available with a system that shields the needle after injection. After you have completed your injection and removed the needle from your skin, firmly push down on the plunger. The protective sleeve will automatically cover the needle and you  will hear a click. The click means the needle is safely covered. °11. Place the syringe in the nearest needle box, also called a sharps container. If you do not have a sharps container, you can use a hard-sided plastic container with a secure lid, such as an empty laundry detergent bottle. °What else do I need to know? °· Do not use enoxaparin if: °? You have allergies to heparin or pork products. °? You have been diagnosed with a condition called thrombocytopenia. °· Do not use the syringe or needle more than one time. °· Use medicines only as directed by your health care provider. °· Changes in medicines, supplements, diet, and illness can affect your anticoagulation therapy. Be sure to inform your health care provider of any of these changes. °· It is important that you tell all of your health care providers and your dentist that you are taking an anticoagulant, especially if you are injured or plan to have any type of procedure. °· While on anticoagulants, you will need to have blood tests done routinely as directed by your health care provider. °· While using this medicine, avoid physical activities or sports that could result in a fall or cause injury. °· Follow up with your laboratory test and health care provider appointments as directed. It is very important to keep your appointments. Not keeping appointments could result in a chronic or permanent injury, pain, or disability. °· Before giving your medicine, you should make sure the injection is a clear and colorless or pale yellow solution. If your medicine becomes discolored or if there are particles in the syringe, do not use   it and notify your health care provider. °· Keep your medicine safely stored at room temperature. °Contact a health care provider if: °· You develop any rashes on your skin. °· You have large areas of bruising on your skin. °· You have any worsening of the condition for which you take Enoxaparin. °· You develop a fever. °Get help  right away if: °· You develop bleeding problems such as: °? Bleeding from the gums or nose that does not stop quickly. °? Vomiting blood or coughing up blood. °? Blood in your urine. °? Blood in your stool, or stool that has a dark, tarry, or coffee grounds appearance. °? A cut that does not stop bleeding within 10 minutes. °These symptoms may represent a serious problem that is an emergency. Do not wait to see if the symptoms will go away. Get medical help right away. Call your local emergency services (911 in the [place] Do not drive yourself to the hospital. °This information is not intended to replace advice given to you by your health care provider. Make sure you discuss any questions you have with your health care provider. °Document Released: 06/03/2004 Document Revised: 04/08/2016 Document Reviewed: 01/17/2014 °Elsevier Interactive Patient Education © 2017 [employer] °Enoxaparin injection °What is this medicine? °ENOXAPARIN (ee nox a PA rin) is used after knee, hip, or abdominal surgeries to prevent blood clotting. It is also used to treat existing blood clots in the lungs or in the veins. °This medicine may be used for other purposes; ask your health care provider or pharmacist if you have questions. °COMMON BRAND NAME(S): Lovenox °What should I tell my health care provider before I take this medicine? °They need to know if you have any of these conditions: °-bleeding disorders, hemorrhage, or hemophilia °-infection of the heart or heart valves °-kidney or liver disease °-previous stroke °-prosthetic heart valve °-recent surgery or delivery of a baby °-ulcer in the stomach or intestine, diverticulitis, or other bowel disease °-an unusual or allergic reaction to enoxaparin, heparin, pork or pork products, other medicines, foods, dyes, or preservatives °-pregnant or trying to get pregnant °-breast-feeding °How should I use this medicine? °This medicine is for injection under the skin. It is usually given by  a health-care professional. You or a family member may be trained on how to give the injections. If you are to give yourself injections, make sure you understand how to use the syringe, measure the dose if necessary, and give the injection. To avoid bruising, do not rub the site where this medicine has been injected. Do not take your medicine more often than directed. Do not stop taking except on the advice of your doctor or health care professional. °Make sure you receive a puncture-resistant container to dispose of the needles and syringes once you have finished with them. Do not reuse these items. Return the container to your doctor or health care professional for proper disposal. °Talk to your pediatrician regarding the use of this medicine in children. Special care may be needed. °Overdosage: If you think you have taken too much of this medicine contact a poison control center or emergency room at once. °NOTE: This medicine is only for you. Do not share this medicine with others. °What if I miss a dose? °If you miss a dose, take it as soon as you can. If it is almost time for your next dose, take only that dose. Do not take double or extra doses. °What may interact with this medicine? °-aspirin and aspirin-like   medicines °-certain medicines that treat or prevent blood clots °-dipyridamole °-NSAIDs, medicines for pain and inflammation, like ibuprofen or naproxen °This list may not describe all possible interactions. Give your health care provider a list of all the medicines, herbs, non-prescription drugs, or dietary supplements you use. Also tell them if you smoke, drink alcohol, or use illegal drugs. Some items may interact with your medicine. °What should I watch for while using this medicine? °Visit your doctor or health care professional for regular checks on your progress. Your condition will be monitored carefully while you are receiving this medicine. °Notify your doctor or health care professional and  seek emergency treatment if you develop breathing problems; changes in vision; chest pain; severe, sudden headache; pain, swelling, warmth in the leg; trouble speaking; sudden numbness or weakness of the face, arm, or leg. These can be signs that your condition has gotten worse. °If you are going to have surgery, tell your doctor or health care professional that you are taking this medicine. °Do not stop taking this medicine without first talking to your doctor. Be sure to refill your prescription before you run out of medicine. °Avoid sports and activities that might cause injury while you are using this medicine. Severe falls or injuries can cause unseen bleeding. Be careful when using sharp tools or knives. Consider using an electric razor. Take special care brushing or flossing your teeth. Report any injuries, bruising, or red spots on the skin to your doctor or health care professional. °What side effects may I notice from receiving this medicine? °Side effects that you should report to your doctor or health care professional as soon as possible: °-allergic reactions like skin rash, itching or hives, swelling of the face, lips, or tongue °-feeling faint or lightheaded, falls °-signs and symptoms of bleeding such as bloody or black, tarry stools; red or dark-brown urine; spitting up blood or brown material that looks like coffee grounds; red spots on the skin; unusual bruising or bleeding from the eye, gums, or nose °Side effects that usually do not require medical attention (report to your doctor or health care professional if they continue or are bothersome): °-pain, redness, or irritation at site where injected °This list may not describe all possible side effects. Call your doctor for medical advice about side effects. You may report side effects to FDA at 1-800-FDA-1088. °Where should I keep my medicine? °Keep out of the reach of children. °Store at room temperature between 15 and 30 degrees C (59 and 86  degrees F). Do not freeze. If your injections have been specially prepared, you may need to store them in the refrigerator. Ask your pharmacist. Throw away any unused medicine after the expiration date. °NOTE: This sheet is a summary. It may not cover all possible information. If you have questions about this medicine, talk to your doctor, pharmacist, or health care provider. °© 2018 Elsevier/Gold Standard (2013-12-04 16:06:21) ° ° ° ° °GASTRIC BYPASS/SLEEVE ° Home Care Instructions ° ° These instructions are to help you care for yourself when you go home. ° °Call: If you have any problems. °• Call [phone] and ask for the surgeon on call °• If you need immediate assistance come to the ER at [facility]. Tell the ER staff you are a new post-op gastric bypass or gastric sleeve patient  °Signs and symptoms to report: • Severe  vomiting or nausea °o If you cannot handle clear liquids for longer than 1 day, call your surgeon °• Abdominal pain which   does not get better after taking your pain medication °• Fever greater than 100.4°  F and chills °• Heart rate over 100 beats a minute °• Trouble breathing °• Chest pain °• Redness,  swelling, drainage, or foul odor at incision (surgical) sites °• If your incisions open or pull apart °• Swelling or pain in calf (lower leg) °• Diarrhea (Loose bowel movements that happen often), frequent watery, uncontrolled bowel movements °• Constipation, (no bowel movements for 3 days) if this happens: °o Take Milk of Magnesia, 2 tablespoons by mouth, 3 times a day for 2 days if needed °o Stop taking Milk of Magnesia once you have had a bowel movement °o Call your doctor if constipation continues °Or °o Take Miralax  (instead of Milk of Magnesia) following the label instructions °o Stop taking Miralax once you have had a bowel movement °o Call your doctor if constipation continues °• Anything you think is “abnormal for you” °  °Normal side effects after surgery: • Unable to sleep at  night or unable to concentrate °• Irritability °• Being tearful (crying) or depressed ° °These are common complaints, possibly related to your anesthesia, stress of surgery, and change in lifestyle, that usually go away a few weeks after surgery. If these feelings continue, call your medical doctor.  °Wound Care: You may have surgical glue, steri-strips, or staples over your incisions after surgery °• Surgical glue: Looks like clear film over your incisions and will wear off a little at a time °• Steri-strips: Adhesive strips of tape over your incisions. You may notice a yellowish color on skin under the steri-strips. This is used to make the steri-strips stick better. Do not pull the steri-strips off - let them fall off °• Staples: Staples may be removed before you leave the hospital °o If you go home with staples, call [facility] for an appointment with your surgeon’s nurse to have staples removed 10 days after surgery, [phone] °• Showering: You may shower two (2) days after your surgery unless your surgeon tells you differently °o Wash gently around incisions with warm soapy water, rinse well, and gently pat dry °o If you have a drain (tube from your incision), you may need someone to hold this while you shower °o No tub baths until staples are removed and incisions are healed °  °Medications: • Medications should be liquid or crushed if larger than the size of a dime °• Extended release pills (medication that releases a little bit at a time through the  day) should not be crushed °• Depending on the size and number of medications you take, you may need to space (take a few throughout the day)/change the time you take your medications so that you do not over-fill your pouch (smaller stomach) °• Make sure you follow-up with you primary care physician to make medication changes needed during rapid weight loss and life -style changes °• If you have diabetes, follow up with your doctor that  orders your diabetes medication(s) within one week after surgery and check your blood sugar regularly ° °• Do not drive while taking narcotics (pain medications) ° °• Do not take acetaminophen (Tylenol) and Roxicet or Lortab Elixir at the same time since these pain medications contain acetaminophen °  °Diet:  °First 2 Weeks You will see the nutritionist about two (2) weeks after your surgery. The nutritionist will increase the types of foods you can eat if you are handling liquids well: °• If you have severe   vomiting or nausea and cannot handle clear liquids lasting longer than 1 day call your surgeon °Protein Shake °• Drink at least 2 ounces of shake 5-6 times per day °• Each serving of protein shakes (usually 8-12 ounces) should have a minimum of: °o 15 grams of protein °o And no more than 5 grams of carbohydrate °• Goal for protein each day: °o Men = 80 grams per day °o Women = 60 grams per day °  ° • Protein powder may be added to fluids such as non-fat milk or Lactaid milk or Soy milk (limit to 35 grams added protein powder per serving) ° °Hydration °• Slowly increase the amount of water and other clear liquids as tolerated (See Acceptable Fluids) °• Slowly increase the amount of protein shake as tolerated °• Sip fluids slowly and throughout the day °• May use sugar substitutes in small amounts (no more than 6-8 packets per day; i.e. Splenda) ° °Fluid Goal °• The first goal is to drink at least 8 ounces of protein shake/drink per day (or as directed by the nutritionist); some examples of protein shakes are Syntrax Nectar, Adkins Advantage, EAS Edge HP, and Unjury. - See handout from pre-op Bariatric Education Class: °o Slowly increase the amount of protein shake you drink as tolerated °o You may find it easier to slowly sip shakes throughout the day °o It is important to get your proteins in first °• Your fluid goal is to drink 64-100 ounces of fluid daily °o It may take a few weeks to build up to this  °• 32  oz. (or more) should be clear liquids °And °• 32 oz. (or more) should be full liquids (see below for examples) °• Liquids should not contain sugar, caffeine, or carbonation ° °Clear Liquids: °• Water of Sugar-free flavored water (i.e. Fruit H²O, Propel) °• Decaffeinated coffee or tea (sugar-free) °• Crystal lite, Wyler’s Lite, Minute Maid Lite °• Sugar-free Jell-O °• Bouillon or broth °• Sugar-free Popsicle:    - Less than 20 calories each; Limit 1 per day ° °Full Liquids: °                  Protein Shakes/Drinks + 2 choices per day of other full liquids °• Full liquids must be: °o No More Than 12 grams of Carbs per serving °o No More Than 3 grams of Fat per serving °• Strained low-fat cream soup °• Non-Fat milk °• Fat-free Lactaid Milk °• Sugar-free yogurt (Dannon Lite & Fit, Greek yogurt) ° °  °Vitamins and Minerals • Start 1 day after surgery unless otherwise directed by your surgeon °• 2 Chewable Multivitamin / Multimineral Supplement with iron (i.e. Centrum for Adults) °• Vitamin B-12, 350-500 micrograms sub-lingual (place tablet under the tongue) each day °• Chewable Calcium Citrate with Vitamin D-3 °(Example: 3 Chewable Calcium  Plus 600 with Vitamin D-3) °o Take 500 mg three (3) times a day for a total of 1500 mg each day °o Do not take all 3 doses of calcium at one time as it may cause constipation, and you can only absorb 500 mg at a time °o Do not mix multivitamins containing iron with calcium supplements;  take 2 hours apart °o Do not substitute Tums (calcium carbonate) for your calcium °• Menstruating women and those at risk for anemia ( a blood disease that causes weakness) may need extra iron °o Talk to your doctor to see if you need more iron °• If you need extra iron: Total daily   Iron recommendation (including Vitamins) is 50 to 100 mg Iron/day °• Do not stop taking or change any vitamins or minerals until you talk to your nutritionist or surgeon °• Your nutritionist and/or surgeon must approve all  vitamin and mineral supplements °  °Activity and Exercise: It is important to continue walking at home. Limit your physical activity as instructed by your doctor. During this time, use these guidelines: °• Do not lift anything greater than ten  (10) pounds for at least two (2) weeks °• Do not go back to work or drive until your surgeon says you can °• You may have sex when you feel comfortable °o It is VERY important for male patients to use a reliable birth control method; fertility often increase after surgery °o Do not get pregnant for at least 18 months °• Start exercising as soon as your doctor tells you that you can °o Make sure your doctor approves any physical activity °• Start with a simple walking program °• Walk 5-15 minutes each day, 7 days per week °• Slowly increase until you are walking 30-45 minutes per day °• Consider joining our BELT program. [phone] or email [email or web address] °  °Special Instructions Things to remember: °• Free counseling is available for you and your family through collaboration between [facility] and [employer]. Please call [phone] and leave a message °• Use your CPAP when sleeping if this applies to you °• Consider buying a medical alert bracelet that says you had lap-band surgery °  °  You will likely have your first fill (fluid added to your band) 6 - 8 weeks after surgery °• [facility] has a free Bariatric Surgery Support Group that meets monthly, the 3rd Thursday, 6pm. [facility] [facility] Classrooms. You can see classes online at [email or web address] °• It is very important to keep all follow up appointments with your surgeon, nutritionist, primary care physician, and behavioral health practitioner °o After the first year, please follow up with your bariatric surgeon and nutritionist at least once a year in order to maintain best weight loss results °      °             [facility]:  [phone] ° °             [facility]: [phone] ° °             Bariatric Nurse Coordinator: [phone]  °Gastric Bypass/Sleeve Home Care Instructions  Rev. 09/2012    ° °                                                    Reviewed and Endorsed °                                                   by [facility] [place] [facility] Patient Education Committee, Jan, 2014 ° ° ° ° ° ° ° ° ° °

## 2017-01-21 ENCOUNTER — Other Ambulatory Visit: Payer: Self-pay | Admitting: *Deleted

## 2017-01-21 NOTE — Patient Outreach (Addendum)
[facility] Care Management  01/21/2017  [name] 07/08/1981 [id number]  Subjective: Telephone call to patient's home  / mobile number, no answer, left HIPAA compliant voicemail message, and requested call back.   Objective:  Per chart review, patient hospitalized 01/17/17 - 01/20/17 for morbid obesity.  Status post Laparoscopic Roux-en-Y gastric bypass (ante-colic, ante-gastric); upper endoscopy on 01/17/17.  Patient had ED visit on 11/05/16 for Abdominal pain.  Patient also has a history of obstructive sleep apnea (OSA), hyperlipidemia, and inguinal hernia.    Assessment: Received [facility] Transition of care referral on 01/19/17.  Transition of care follow up pending patient contact.    Plan: RNCM will call patient for 2nd telephone outreach attempt, transition of care follow up, within 10 business days if no return call.   Jesse Pamintuan H. Gardiner Barefootooper RN, BSN, CCM [facility] Telephonic CM Phone: [phone] Fax: [phone]

## 2017-01-24 ENCOUNTER — Other Ambulatory Visit: Payer: Self-pay | Admitting: *Deleted

## 2017-01-24 MED FILL — oxyCODONE HCL 5 MG/5ML SOLN: 5 | 2 days supply | Qty: 100 | Fill #0

## 2017-01-24 NOTE — Patient Outreach (Signed)
[facility] Care Management  01/24/2017  Jesse BreamLeroy D Chan [birth year] [id number]   Subjective: Telephone call to patient's home / mobile number, spoke with patient, and HIPAA verified.  [facility] Transition of care follow up, patient voiced understanding, and is in agreement to follow up.  Patient gave verbal authorization and requested that RNCM call wife [name]([name]) regarding his healthcare needs as needed.  States his wife in a Facilities managernurse manager at [employer] and handles all of his healthcare.  Telephone call to patient's wife work number( [phone]) per patient's  request, no answer, message states voicemail is full, and unable to accept messages.    Objective:  Per chart review, patient hospitalized 01/17/17 - 01/20/17 for morbid obesity.  Status post Laparoscopic Roux-en-Y gastric bypass (ante-colic, ante-gastric); upper endoscopy on 01/17/17.  Patient had ED visit on 11/05/16 for Abdominal pain.  Patient also has a history of obstructive sleep apnea (OSA), hyperlipidemia, and inguinal hernia.    Assessment: Received [facility] Transition of care referral on 01/19/17. Transition of care follow up pending patient contact.    Plan: RNCM will call patient for 3rd telephone outreach attempt, transition of care follow up, within 10 business days if no return call.   Jesse Pavlich H. Gardiner Barefootooper RN, BSN, CCM [facility] Telephonic CM Phone: [phone] Fax: [phone]

## 2017-01-24 NOTE — Discharge Summary (Signed)
Physician Discharge Summary  Jesse BreamLeroy D Enzor ZOX:[id number] DOB: [birth year] DOA: 01/17/2017  PCP: Renford DillsPolite, Ronald, MD  Admit date: 01/17/2017 Discharge date: 01/20/2017  Recommendations for Outpatient Follow-up:    Follow-up Information    Gaynelle AduWilson, Lovelee Forner, MD. Go on 02/03/2017.   Specialty:  General Surgery Why:  at [address] Contact information: [address] [place] [address] [phone]        Gaynelle AduWilson, Juna Caban, MD Follow up.   Specialty:  General Surgery Contact information: [address] [place] [address] [phone]          Discharge Diagnoses:  Principal Problem:   Morbid obesity (HCC) Active Problems:   GERD   Hypercholesterolemia   OSA on CPAP   Surgical Procedure: Laparoscopic Roux-en-Y gastric bypass, upper endoscopy  Discharge Condition: Good Disposition: Home  Diet recommendation: Postoperative gastric bypass diet  Filed Weights   01/17/17 0556 01/18/17 1453 01/19/17 1500  Weight: (!) 156.9 kg (346 lb) (!) 157.3 kg (346 lb 12.5 oz) (!) 157.8 kg (347 lb 14.2 oz)     Hospital Course:  The patient was admitted for a planned laparoscopic Roux-en-Y gastric bypass. Please see operative note. Preoperatively the patient was given 5000 units of subcutaneous heparin for DVT prophylaxis. Postoperative prophylactic Lovenox dosing was started on the morning of postoperative day 1. The patient was started on ice chips and water which they tolerated. On postoperative day 2 The patient's diet was advanced to protein shakes which they also tolerated but did not meet criteria for discharge. He had a fair amount bilateral upper abdominal pain which seemed more musculoskeletal pain.  The patient was ambulating without difficulty. Their vital signs are stable without fever or tachycardia. Their hemoglobin had remained stable. The patient was maintained on their home settings for CPAP therapy. On POD 3 his pain was better, his oral intake meet criteria for dc.   The patient had received discharge instructions and counseling. They were deemed stable for discharge.  BP 114/63 (BP Location: Left Arm)   Pulse 78   Temp 98.5 F (36.9 C) (Oral)   Resp 18   Ht 5\' 7"  (1.702 m)   Wt (!) 157.8 kg (347 lb 14.2 oz)   SpO2 98%   BMI 54.49 kg/m   Gen: alert, NAD, non-toxic appearing Pupils: equal, no scleral icterus Pulm: Lungs clear to auscultation, symmetric chest rise CV: regular rate and rhythm Abd: soft, minimal tenderness, nondistended.  No cellulitis. No incisional hernia Ext: no edema, no calf tenderness Skin: no rash, no jaundice  Discharge Instructions  Discharge Instructions    AMB [facility]    Complete by:  As directed    Please assign UMR member for post discharge call. [facility]. Raiford NobleAtika Hall, MSN-Ed, [facility] Care Management Hospital [phone]   Reason for consult:  Please assign UMR member for post discharge call.   Expected date of contact:  1-3 days (reserved for hospital discharges)   Ambulate hourly while awake    Complete by:  As directed    Call MD for:  difficulty breathing, headache or visual disturbances    Complete by:  As directed    Call MD for:  persistant dizziness or light-headedness    Complete by:  As directed    Call MD for:  persistant nausea and vomiting    Complete by:  As directed    Call MD for:  redness, tenderness, or signs of infection (pain, swelling, redness, odor  or green/yellow discharge around incision site)    Complete by:  As directed    Call MD for:  severe uncontrolled pain    Complete by:  As directed    Call MD for:  temperature >101 F    Complete by:  As directed    Diet bariatric full liquid    Complete by:  As directed    Discharge instructions    Complete by:  As directed    See bariatric discharge instructions   Incentive spirometry    Complete by:  As directed    Perform hourly while awake     Allergies as of 01/20/2017       Reactions   Amoxicillin Other (See Comments)   Internal burning and itching      Medication List    STOP taking these medications   esomeprazole 20 MG capsule Commonly known as:  NEXIUM     TAKE these medications   albuterol 108 (90 Base) MCG/ACT inhaler Commonly known as:  PROVENTIL HFA;VENTOLIN HFA Inhale 2 puffs into the lungs every 6 (six) hours as needed for wheezing or shortness of breath.   BARIATRIC FUSION PO Take 1 capsule by mouth daily.   Calcium-Vitamin D-Vitamin K 500-100-40 MG-UNT-MCG Chew Chew 1 tablet by mouth daily.   enoxaparin 40 MG/0.4ML injection Commonly known as:  LOVENOX Inject 0.4 mLs (40 mg total) into the skin daily.   loratadine 10 MG tablet Commonly known as:  CLARITIN Take 10 mg by mouth daily as needed for allergies.   oxyCODONE 5 MG immediate release tablet Commonly known as:  Oxy IR/ROXICODONE Take 1-2 tablets (5-10 mg total) by mouth every 4 (four) hours as needed for moderate pain, severe pain or breakthrough pain.   pantoprazole 40 MG tablet Commonly known as:  PROTONIX Take 1 tablet (40 mg total) by mouth daily.      Follow-up Information    Gaynelle Adu, MD. Go on 02/03/2017.   Specialty:  General Surgery Why:  at [address] information: [address] [place] [address] [phone]        Gaynelle Adu, MD Follow up.   Specialty:  General Surgery Contact information: [address] [place] [address] [phone]            The results of significant diagnostics from this hospitalization (including imaging, microbiology, ancillary and laboratory) are listed below for reference.    Significant Diagnostic Studies: No results found.  Labs: Basic Metabolic Panel:  Recent Labs Lab 01/18/17 0638  NA 136  K 3.8  CL 99*  CO2 28  GLUCOSE 117*  BUN 10  CREATININE 1.02  CALCIUM 9.0   Liver Function Tests:  Recent Labs Lab 01/18/17 0638  AST 112*  ALT 184*  ALKPHOS 78   BILITOT 0.3  PROT 7.3  ALBUMIN 3.5    CBC:  Recent Labs Lab 01/18/17 0638 01/18/17 1552 01/19/17 0601  WBC 12.2*  --  8.5  NEUTROABS 9.5*  --  6.0  HGB 12.5* 12.5* 12.3*  HCT 37.8* 38.0* 37.6*  MCV 82.7  --  83.2  PLT 258  --  220    CBG: No results for input(s): GLUCAP in the last 168 hours.  Principal Problem:   Morbid obesity (HCC) Active Problems:   GERD   Hypercholesterolemia   OSA on CPAP   Time coordinating discharge: 15 min  Signed:  Atilano Ina, MD [facility], [place] [phone] 01/24/2017, 12:23  PM

## 2017-01-25 ENCOUNTER — Encounter: Payer: Self-pay | Admitting: *Deleted

## 2017-01-25 ENCOUNTER — Other Ambulatory Visit: Payer: Self-pay | Admitting: *Deleted

## 2017-01-25 NOTE — Patient Outreach (Signed)
[facility] Care Management  01/25/2017  Jaynie BreamLeroy D Burnett 03/08/1981 [id number]   Subjective: Telephone call to patient's wife work number, spoke with wife per patient's verbal authorization, wife stated patient's name, date of birth, and address.  Wife request call back on work office phone [phone]). Telephone call to patient's wife work office number, spoke with wife, stated patient's name, date of birth, and address.  Discussed [facility] Care [facility]MR Transition of care follow up, wife voiced understanding, and is in agreement to follow up on patient's behalf. Wife states patient is doing okay, adjusting to liquid diet, has a follow up appointment with nutritionist on 02/01/17, with primary MD on 03/03/17, and surgeon on 03/09/17.  State she used pal annual leave (PAL) to be off with husband, is aware of how to file claim with [employer], and does need to file claim at this time.    States she is not sure if she purchased the hospital indemnity supplemental insurance.   RNCM verbally gave wife contact phone number  [facility] ( [phone]), wife voices understanding, is appreciative of the information, states she will follow up, and file claim if appropriate.  Wife states patient does not have any transition of care, care coordination, disease management, disease monitoring, transportation, community resource, or pharmacy needs at this time.  States she is very appreciative of the follow up and is in agreement to receive [facility] information.     Objective: Per chart review, patient hospitalized 01/17/17 - 01/20/17 for morbid obesity. Status post Laparoscopic Roux-en-Y gastric bypass (ante-colic, ante-gastric); upper endoscopyon 01/17/17. Patient had ED visit on 11/05/16 for Abdominal pain. Patient also has a history of obstructive sleep apnea (OSA), hyperlipidemia, and inguinal hernia.    Assessment: Received UMR Transition of care referral on 01/19/17.  Transition of care follow up completed, no care management needs, and will proceed with case closure.   Plan: RNCM will send patient successful outreach letter, [facility] pamphlet, and magnet. RNCM will send case closure due to follow up completed / no care management needs request to Iverson AlaminLaura Greeson at [facility].    Tremayne Sheldon H. Gardiner Barefootooper RN, BSN, CCM [facility] Care [facility] Telephonic CM Phone: [phone] Fax: [phone]

## 2017-01-27 ENCOUNTER — Telehealth (HOSPITAL_COMMUNITY): Payer: Self-pay

## 2017-01-27 NOTE — Telephone Encounter (Addendum)
01-27-17 @ 0945 Left message on voice with call back information.  Unable to complete discharge questions at this time    Made discharge phone call to patient.. Asking the following questions.    1. Do you have someone to care for you now that you are home?  Staying at home alone now 2. Are you having pain now that is not relieved by your pain medication?  Taking medication only at night and evening 3. Are you able to drink the recommended daily amount of fluids (48 ounces minimum/day) and protein (60-80 grams/day) as prescribed by the dietitian or nutritional counselor?  43 ounces of water, 37 grams of protein.  Strategies provided to patient regarding protein intake 4. Are you taking the vitamins and minerals as prescribed?  Yes, was only taking 2 calcium instructed to increase to 3 5. Do you have the "on call" number to contact your surgeon if you have a problem or question?  yes 6. Are your incisions free of redness, swelling or drainage? (If steri strips, address that these can fall off, shower as tolerated) look good 7. Have your bowels moved since your surgery?  If not, are you passing gas?  yes 8. Are you up and walking 3-4 times per day?  yes 9. Were you provided your discharge medications before your surgery or before you were discharged from the hospital and are you taking them without problem?  yes

## 2017-02-01 ENCOUNTER — Encounter: Payer: 59 | Attending: General Surgery | Admitting: Skilled Nursing Facility1

## 2017-02-01 ENCOUNTER — Encounter: Payer: Self-pay | Admitting: Skilled Nursing Facility1

## 2017-02-01 DIAGNOSIS — Z713 Dietary counseling and surveillance: Secondary | ICD-10-CM | POA: Diagnosis not present

## 2017-02-01 DIAGNOSIS — Z6841 Body Mass Index (BMI) 40.0 and over, adult: Secondary | ICD-10-CM | POA: Insufficient documentation

## 2017-02-01 NOTE — Progress Notes (Signed)
Bariatric Class:  Appt start time: 1530 end time:  1630.  2 Week Post-Operative Nutrition Class  Patient was seen on 02/01/2017 for Post-[facility]ative [facility]   Pt states he has only been getting in sips here and there of protein shakes since surgery never drinking a whole one. Pt states he just does not like them. Pt was visibly vexed at taking out specific foods and having to rely specifically on just protein foods: pt asked about wraps several times.Dietitan educated the pt on wraps as well as many different protein supplement options.    Surgery date: 01/17/2017 Surgery type: RYGB Start weight at [facility]: 342.9 Weight today: 315.4  TANITA  BODY COMP RESULTS  02/01/2017   BMI (kg/m^2) 49.4   Fat Mass (lbs) 177.8   Fat Free Mass (lbs) 137.6   Total Body Water (lbs) N/a   The following the learning objectives were met by the patient during this course:  Identifies Phase 3A (Soft, High Proteins) Dietary Goals and will begin from 2 weeks post-operatively to 2 months post-operatively  Identifies appropriate sources of fluids and proteins   States protein recommendations and appropriate sources post-operatively  Identifies the need for appropriate texture modifications, mastication, and bite sizes when consuming solids  Identifies appropriate multivitamin and calcium sources post-operatively  Describes the need for physical activity post-operatively and will follow MD recommendations  States when to call healthcare provider regarding medication questions or post-operative complications  Handouts given during class include:  Phase 3A: Soft, High Protein Diet Handout  Follow-Up Plan: Patient will follow-up at [facility] in 6 weeks for 2 month post-op nutrition visit for diet advancement per MD.

## 2017-02-08 DIAGNOSIS — H52223 Regular astigmatism, bilateral: Secondary | ICD-10-CM | POA: Diagnosis not present

## 2017-02-14 ENCOUNTER — Encounter: Payer: 59 | Attending: General Surgery | Admitting: Registered"

## 2017-02-14 DIAGNOSIS — Z6841 Body Mass Index (BMI) 40.0 and over, adult: Secondary | ICD-10-CM | POA: Diagnosis not present

## 2017-02-14 DIAGNOSIS — Z713 Dietary counseling and surveillance: Secondary | ICD-10-CM | POA: Diagnosis not present

## 2017-02-14 DIAGNOSIS — E669 Obesity, unspecified: Secondary | ICD-10-CM

## 2017-02-14 NOTE — Progress Notes (Signed)
Follow-up visit:  4 Weeks Post-Operative RYGB Surgery  Medical Nutrition Therapy:  Appt start time: 4:09 end time:  4:45.  Primary concerns today: Post-operative Bariatric Surgery Nutrition Management.  Non scale victories: no longer using CPAP machine  Surgery date: 01/17/2017 Surgery type: RYGB [facility]: [facility].9 lbs Weight today: 303.4 lbs Weight change: 12 lbs from 315.4 lbs (02/01/2017) Total weight lost: 39.5 lbs Weight loss goal: none stated  TANITA  BODY COMP RESULTS  02/01/2017 02/14/2017   BMI (kg/m^2) 49.4 47.5   Fat Mass (lbs) 177.8 153.8   Fat Free Mass (lbs) 137.6 149.6   Total Body Water (lbs) N/A N/A    Pt states he has struggles with limited options and wants to advance himself. Pt reports he is feeling better since attending church yesterday for the first time. Pt states he has tried chicken salad with relish; tolerated well. Pt states he has not had sweets nor bread. Pt states he used a "flat" ginger ale once when feling nauseous; tolerated well. Pt states he has not been able to handle protein shakes well and going to try unflavored protein powder with unsweetened almond milk. Pt states his best eating day was yesterday. Pt states pizza is his favorite food and has not tried it yet. Pt states he wants to advance his diet himself because he is tolerating the things he has tried so well. Pt states he needs more variety.   Pt states his highest weight was 356.0 lbs and on surgery date he was 347.9 lbs (01/17/2017).   Preferred Learning Style:   No preference indicated   Learning Readiness:   Ready  Change in progress  24-hr recall: B (AM): none Snk (AM): none  L (PM): Longhorn-3/4 Parmesan crusted chicken (51g), 4 spoons sweet potato, 2 broccoli florets  Snk (PM): 4 cubes of chex mix D (PM): chicken salad (7g), cheese stick (7g) Snk (PM): cheese (7g), chicken salad (7g)  Fluid intake: water (100 oz) Estimated total protein intake:  79g  Medications: See list Supplementation: yes  Using straws: not stated Drinking while eating: sometimes Having you been chewing well: yes Chewing/swallowing difficulties: no Changes in vision: not stated Changes to mood/headaches: not stated Hair loss/Changes to skin/Changes to nails: not stated Any difficulty focusing or concentrating: not stated Sweating: not stated Dizziness/Lightheaded: not stated Palpitations: not stated Carbonated beverages: tried once N/V/D/C/GAS: no Abdominal Pain: no Dumping syndrome: no Last Lap-Band fill: N/A  Recent physical activity:  Moderate strength training at [employer]  Progress Towards Goal(s):  In progress.  Handouts given during visit include:  Protein Shake options   Nutritional Diagnosis:  NI-5.7.1 Inadequate protein intake As related to bariatric surgery post-op recommendations.  As evidenced by dietary recall of less than 80g protein.    Intervention:  Nutrition education and couseling. RD educated pt on importance of meeting daily protein needs and a variety of protein sources to increase protein intake.   Teaching Method Utilized:  Visual Auditory  Barriers to learning/adherence to lifestyle change: desire for a variety more than protein items  Demonstrated degree of understanding via:  Teach Back   Monitoring/Evaluation:  Dietary intake, exercise, lap band fills, and body weight. Follow up in 2 weeks for 6 week post-op visit.

## 2017-03-02 ENCOUNTER — Encounter: Payer: 59 | Admitting: Registered"

## 2017-03-02 ENCOUNTER — Encounter: Payer: Self-pay | Admitting: Registered"

## 2017-03-02 DIAGNOSIS — Z713 Dietary counseling and surveillance: Secondary | ICD-10-CM | POA: Diagnosis not present

## 2017-03-02 DIAGNOSIS — E669 Obesity, unspecified: Secondary | ICD-10-CM

## 2017-03-02 DIAGNOSIS — Z6841 Body Mass Index (BMI) 40.0 and over, adult: Secondary | ICD-10-CM | POA: Diagnosis not present

## 2017-03-02 NOTE — Progress Notes (Signed)
Follow-up visit:  4 Weeks Post-Operative RYGB Surgery  Medical Nutrition Therapy:  Appt start time: 4:00 end time:  4:31  Primary concerns today: Post-operative Bariatric Surgery Nutrition Management.  Non scale victories: able to golf better, not as tired as before, able to go up and down stairs, able to ADLs better   Surgery date: 01/17/2017 Surgery type: RYGB Start weight at [facility]: 342.9 lbs Weight today: 304.4 lbs Weight change: 1 lbs gain from 303.4 lbs (02/14/2017) Total weight lost: 39.5 lbs Weight loss goal: 250 lbs  TANITA  BODY COMP RESULTS  02/01/2017 02/14/2017 03/02/2017   BMI (kg/m^2) 49.4 47.5 47.7   Fat Mass (lbs) 177.8 153.8 118.2   Fat Free Mass (lbs) 137.6 149.6 186.2   Total Body Water (lbs) N/A N/A 141.6    Pt states his heaviest was 359 lbs wants to decrease to 250 lbs. Pt states he got sick from eating grilled chicken from [employer]; had 2 small cans of ginger ale to help with nausea. Pt states he was dehydrated once due to not keeping his cup on him. Pt reports being more mindful of taking in fluids. Pt states while on vacation last week he had a small amount of fruit; tolerated well. Pt states he has been attending [employer] doing strength training and he's more focused now for the first time in 3 years. Pt states he wants to lose about 50 more lbs. 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  Pt states he has struggles with limited options and wants to advance himself. Pt reports he is feeling better since attending church yesterday for the first time. Pt states he has tried chicken salad with relish; tolerated well. Pt states he has not had sweets nor bread. Pt states he used a "flat" ginger ale once when feling nauseous; tolerated well. Pt states he has not been able to handle protein shakes well and going to try unflavored protein powder with unsweetened almond milk. Pt states his best eating day was yesterday. Pt states pizza is his favorite food and has not  tried it yet. Pt states he wants to advance his diet himself because he is tolerating the things he has tried so well. Pt states he needs more variety.   Pt states his highest weight was 356.0 lbs and on surgery date he was 347.9 lbs (01/17/2017).   Preferred Learning Style:   No preference indicated   Learning Readiness:   Ready  Change in progress  24-hr recall: B (AM): sometimes 1 egg (6g), 2 slices bacon (6g); sometimes skips Snk (AM): none L (PM): Mexican-3 oz chicken (21g), cheese sauce or 6-10 Hooter's wings Snk (PM): greek yogurt (15g), trail mix, handful of cashews, cheez-its D (PM): chicken salad (7g), cheese stick (7g); or shrimp (21g), 2 oz (14g) Snk (PM): cheese (7g), chicken salad (7g)  Fluid intake: light lemonade, Premier protein (11 oz), water (100 oz) Estimated total protein intake: 80+ grams   Medications: See list Supplementation: yes  Using straws: no Drinking while eating: a little Having you been chewing well: sometimes Chewing/swallowing difficulties: no Changes in vision: not stated Changes to mood/headaches: not stated Hair loss/Changes to skin/Changes to nails: not stated Any difficulty focusing or concentrating: not stated Sweating: not stated Dizziness/Lightheaded: not stated Palpitations: not stated Carbonated beverages: tried once N/V/D/C/GAS: no Abdominal Pain: no Dumping syndrome: no Last Lap-Band fill: N/A  Recent physical activity:  Cardio and strength training at [employer], walking 1.5 mi/daily  Progress Towards Goal(s):  In  progress.  Handouts given during visit include:  none    Nutritional Diagnosis:  Inadequate fluid intake As related to bariatric surgery post-op recommendations.  As evidenced by pt reports not getting enough fluids and one-time experience of dehydration.    Intervention:  Nutrition education and couseling. RD educated pt on importance of meeting daily protein needs and a variety of protein sources to  increase protein intake as well as meeting fluid needs.   Goals: - Aim to drink Premier Protein shake for breakfast (30g)   Teaching Method Utilized:  Visual Auditory  Barriers to learning/adherence to lifestyle change: desire for a variety more than protein items  Demonstrated degree of understanding via:  Teach Back   Monitoring/Evaluation:  Dietary intake, exercise, lap band fills, and body weight. Follow up in 3 weeks for 9 week post-op visit.

## 2017-03-02 NOTE — Patient Instructions (Addendum)
-   Aim to drink Premier Protein shake for breakfast (30g)

## 2017-03-04 DIAGNOSIS — Z6841 Body Mass Index (BMI) 40.0 and over, adult: Secondary | ICD-10-CM | POA: Diagnosis not present

## 2017-03-04 DIAGNOSIS — Z713 Dietary counseling and surveillance: Secondary | ICD-10-CM | POA: Diagnosis not present

## 2017-03-15 ENCOUNTER — Ambulatory Visit: Payer: Self-pay | Admitting: Registered"

## 2017-03-28 ENCOUNTER — Ambulatory Visit: Payer: Self-pay | Admitting: Registered"

## 2017-04-21 ENCOUNTER — Encounter (HOSPITAL_COMMUNITY): Payer: Self-pay

## 2017-04-28 MED FILL — levoFLOXacin 500 MG TABS: 500 | 5 days supply | Qty: 5 | Fill #0

## 2017-06-04 ENCOUNTER — Ambulatory Visit (HOSPITAL_COMMUNITY)
Admission: EM | Admit: 2017-06-04 | Discharge: 2017-06-04 | Disposition: A | Discharge status: Home / Self Care | Payer: 59 | Attending: Family Medicine | Admitting: Family Medicine

## 2017-06-04 ENCOUNTER — Encounter (HOSPITAL_COMMUNITY): Payer: Self-pay | Admitting: Emergency Medicine

## 2017-06-04 DIAGNOSIS — J0101 Acute recurrent maxillary sinusitis: Secondary | ICD-10-CM | POA: Diagnosis not present

## 2017-06-04 DIAGNOSIS — R05 Cough: Secondary | ICD-10-CM

## 2017-06-04 DIAGNOSIS — R059 Cough, unspecified: Secondary | ICD-10-CM

## 2017-06-04 MED ORDER — LEVOFLOXACIN 750 MG PO TABS: 750.0000 mg | ORAL_TABLET | Freq: Every day | ORAL | 0 refills | Status: DC

## 2017-06-04 MED ORDER — HYDROCODONE-HOMATROPINE 5-1.5 MG/5ML PO SYRP: 5.0000 mL | ORAL_SOLUTION | Freq: Four times a day (QID) | ORAL | 0 refills | Status: DC | PRN

## 2017-06-04 NOTE — ED Provider Notes (Signed)
  [facility]   [id number] 06/04/17 Arrival Time: 1418  ASSESSMENT & PLAN:  1. Acute recurrent maxillary sinusitis   2. Cough     Meds ordered this encounter  Medications  . HYDROcodone-homatropine (HYCODAN) 5-1.5 MG/5ML syrup    Sig: Take 5 mLs by mouth every 6 (six) hours as needed for cough.    Dispense:  60 mL    Refill:  0  . levofloxacin (LEVAQUIN) 750 MG tablet    Sig: Take 1 tablet (750 mg total) by mouth daily.    Dispense:  7 tablet    Refill:  0    OTC symptom care as needed. Cough medication sedation precautions. May return as needed.  Reviewed expectations re: course of current medical issues. Questions answered. Outlined signs and symptoms indicating need for more acute intervention. Patient verbalized understanding. After Visit Summary given.   SUBJECTIVE:  Jaynie BreamLeroy D Conteh is a 36 y.o. male who presents with complaint of nasal congestion, post-nasal drainage, and a persistent cough. Reports long history of recurrent sinusitis. Followed by his PCP. Usually is prescribed Levaquin. Onset last week he believes. Overall fatigued. SOB: none. Wheezing: none. OTC treatment: Decongestant without much relief. Requests cough medication.  ROS: As per HPI.   OBJECTIVE:  Vitals:   06/04/17 1452  BP: (!) 110/43  Pulse: 84  Resp: (!) 22  Temp: 98.4 F (36.9 C)  TempSrc: Oral  SpO2: 100%    General appearance: alert; no distress HEENT: nasal congestion; clear runny nose; throat irritation secondary to post-nasal drainage; maxillary sinus tenderness Neck: supple without LAD Lungs: clear to auscultation bilaterally; dry cough Skin: warm and dry Psychological: alert and cooperative; normal mood and affect   Allergies  Allergen Reactions  . Amoxicillin Other (See Comments)    Internal burning and itching    Past Medical History:  Diagnosis Date  . Allergy   . Asthma    inhaler as needed  . Esophagitis   . GERD (gastroesophageal reflux disease)    . Hypercholesterolemia   . Obesity   . Sleep apnea    cpap   Social History   Social History  . Marital status: Married    Spouse name: N/A  . Number of children: N/A  . Years of education: N/A   Occupational History  . claims adjuster   . [employer]    Social History Main Topics  . Smoking status: Never Smoker  . Smokeless tobacco: Never Used  . Alcohol use No  . Drug use: No  . Sexual activity: Yes   Other Topics Concern  . Not on file   Social History Narrative   Daily caffeine   Family History  Problem Relation Age of Onset  . Breast cancer Mother   . Diabetes Unknown   . Lymphoma Unknown   . Colon cancer Maternal Grandfather   . Esophageal cancer Neg Hx   . Stomach cancer Neg Hx            Mardella LaymanHagler, Adeana Grilliot, MD 06/04/17 1504

## 2017-06-04 NOTE — ED Triage Notes (Signed)
Onset of symptoms Wednesday night.  Headache, fever Thursday night and low grade temp last night.  Sinus drainage, drainage in throat, ears and throat hurting .

## 2017-08-18 DIAGNOSIS — J0101 Acute recurrent maxillary sinusitis: Secondary | ICD-10-CM | POA: Diagnosis not present

## 2017-08-18 MED FILL — predniSONE 5 MG TABS: 5 | 6 days supply | Qty: 21 | Fill #0

## 2017-08-18 MED FILL — MOXIFLOXACIN HCL 400 MG TAB: 400 | 10 days supply | Qty: 10 | Fill #0

## 2017-09-06 DIAGNOSIS — Z23 Encounter for immunization: Secondary | ICD-10-CM | POA: Diagnosis not present

## 2017-09-06 DIAGNOSIS — J302 Other seasonal allergic rhinitis: Secondary | ICD-10-CM | POA: Diagnosis not present

## 2017-09-06 DIAGNOSIS — Z0000 Encounter for general adult medical examination without abnormal findings: Secondary | ICD-10-CM | POA: Diagnosis not present

## 2017-09-06 DIAGNOSIS — Z13220 Encounter for screening for lipoid disorders: Secondary | ICD-10-CM | POA: Diagnosis not present

## 2017-09-06 DIAGNOSIS — K219 Gastro-esophageal reflux disease without esophagitis: Secondary | ICD-10-CM | POA: Diagnosis not present

## 2017-09-06 DIAGNOSIS — G4733 Obstructive sleep apnea (adult) (pediatric): Secondary | ICD-10-CM | POA: Diagnosis not present

## 2017-09-06 DIAGNOSIS — E7800 Pure hypercholesterolemia, unspecified: Secondary | ICD-10-CM | POA: Diagnosis not present

## 2017-11-17 ENCOUNTER — Encounter (HOSPITAL_COMMUNITY): Payer: Self-pay

## 2017-11-29 DIAGNOSIS — K219 Gastro-esophageal reflux disease without esophagitis: Secondary | ICD-10-CM | POA: Diagnosis not present

## 2017-11-29 DIAGNOSIS — Z9884 Bariatric surgery status: Secondary | ICD-10-CM | POA: Diagnosis not present

## 2017-11-29 DIAGNOSIS — G4733 Obstructive sleep apnea (adult) (pediatric): Secondary | ICD-10-CM | POA: Diagnosis not present

## 2017-11-29 DIAGNOSIS — E785 Hyperlipidemia, unspecified: Secondary | ICD-10-CM | POA: Diagnosis not present

## 2017-11-29 DIAGNOSIS — Z9989 Dependence on other enabling machines and devices: Secondary | ICD-10-CM | POA: Diagnosis not present

## 2018-02-02 DIAGNOSIS — R42 Dizziness and giddiness: Secondary | ICD-10-CM | POA: Diagnosis not present

## 2018-02-02 DIAGNOSIS — J302 Other seasonal allergic rhinitis: Secondary | ICD-10-CM | POA: Diagnosis not present

## 2018-02-02 DIAGNOSIS — J0101 Acute recurrent maxillary sinusitis: Secondary | ICD-10-CM | POA: Diagnosis not present

## 2018-02-02 MED FILL — MOXIFLOXACIN HCL 400 MG TAB: 400 | 10 days supply | Qty: 10 | Fill #0

## 2018-02-02 MED FILL — FLUTICASONE PROP 50 MCG SPR: 50 | 60 days supply | Qty: 16 | Fill #0

## 2018-02-02 MED FILL — MECLIZINE 25 MG TABLET: 25 | 7 days supply | Qty: 21 | Fill #0

## 2018-07-10 DIAGNOSIS — J0101 Acute recurrent maxillary sinusitis: Secondary | ICD-10-CM | POA: Diagnosis not present

## 2018-07-10 DIAGNOSIS — J209 Acute bronchitis, unspecified: Secondary | ICD-10-CM | POA: Diagnosis not present

## 2018-07-10 MED FILL — MOXIFLOXACIN HCL 400 MG TAB: 400 | 10 days supply | Qty: 10 | Fill #0

## 2018-07-10 MED FILL — predniSONE 10 MG TABS: 10 | 6 days supply | Qty: 21 | Fill #0

## 2018-07-18 DIAGNOSIS — J019 Acute sinusitis, unspecified: Secondary | ICD-10-CM | POA: Diagnosis not present

## 2018-07-18 DIAGNOSIS — R05 Cough: Secondary | ICD-10-CM | POA: Diagnosis not present

## 2018-07-18 MED FILL — MOXIFLOXACIN HCL 400 MG TAB: 400 | 7 days supply | Qty: 7 | Fill #0

## 2018-08-10 DIAGNOSIS — J111 Influenza due to unidentified influenza virus with other respiratory manifestations: Secondary | ICD-10-CM | POA: Diagnosis not present

## 2018-08-10 MED FILL — OSELTAMIVIR PHOSPHATE 75 MG: 75 | 5 days supply | Qty: 10 | Fill #0

## 2018-08-15 DIAGNOSIS — J111 Influenza due to unidentified influenza virus with other respiratory manifestations: Secondary | ICD-10-CM | POA: Diagnosis not present

## 2018-08-15 DIAGNOSIS — R062 Wheezing: Secondary | ICD-10-CM | POA: Diagnosis not present

## 2018-08-15 DIAGNOSIS — R05 Cough: Secondary | ICD-10-CM | POA: Diagnosis not present

## 2018-09-12 DIAGNOSIS — Z6841 Body Mass Index (BMI) 40.0 and over, adult: Secondary | ICD-10-CM | POA: Diagnosis not present

## 2018-09-12 DIAGNOSIS — Z0000 Encounter for general adult medical examination without abnormal findings: Secondary | ICD-10-CM | POA: Diagnosis not present

## 2018-09-12 DIAGNOSIS — E7800 Pure hypercholesterolemia, unspecified: Secondary | ICD-10-CM | POA: Diagnosis not present

## 2018-09-12 DIAGNOSIS — N529 Male erectile dysfunction, unspecified: Secondary | ICD-10-CM | POA: Diagnosis not present

## 2018-09-22 MED FILL — SILDENAFIL CITRATE 20 MG TA: 20 | 30 days supply | Qty: 90 | Fill #0

## 2018-11-06 IMAGING — RF DG UGI W/ KUB
15 of 20 series · 15 of 24 positions shown · non-contrast
Comparison: 11/05/2016 CT.

CLINICAL DATA: 35-year-old male pre Roux-en-Y procedure. No
complaints.

EXAM:
UPPER GI SERIES WITH KUB
TECHNIQUE: After obtaining a scout radiograph a routine upper GI series was
performed using thin barium per protocol.
FLUOROSCOPY TIME:  Fluoroscopy Time:  1 minutes and 18 seconds
Radiation Exposure Index (if provided by the fluoroscopic device):
114.7 mGy

[Series 1: t abdomen supine · 0.15mm/px · 1 of 1 slices shown]
[im 1/1]
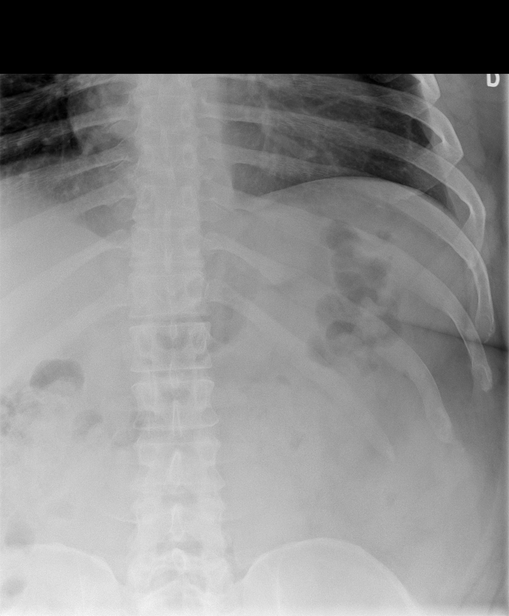

[Series 4: cp_standard · 0.26mm/px · 1 of 1 slices shown (1 of 10)]
[im 1/1]
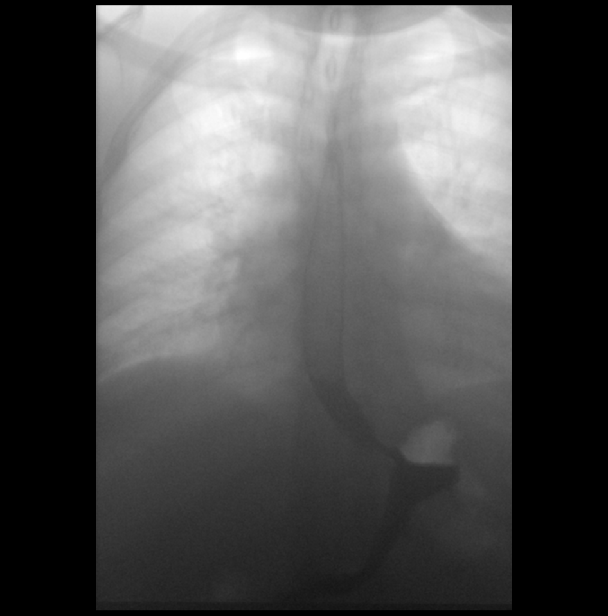

[Series 6: cp_standard · 0.26mm/px · 1 of 1 slices shown (2 of 10)]
[im 1/1]
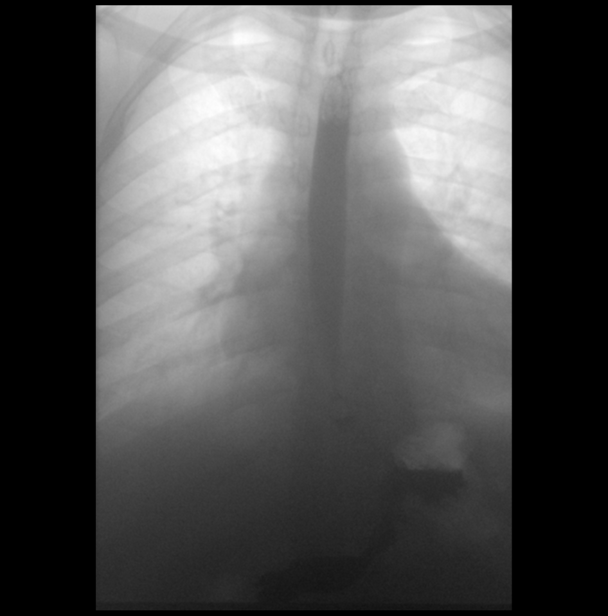

[Series 7: fluoro_barium 2fps_bw · 0.17mm/px · 1 of 5 frames shown (1 of 4)]
[frame 3/5]
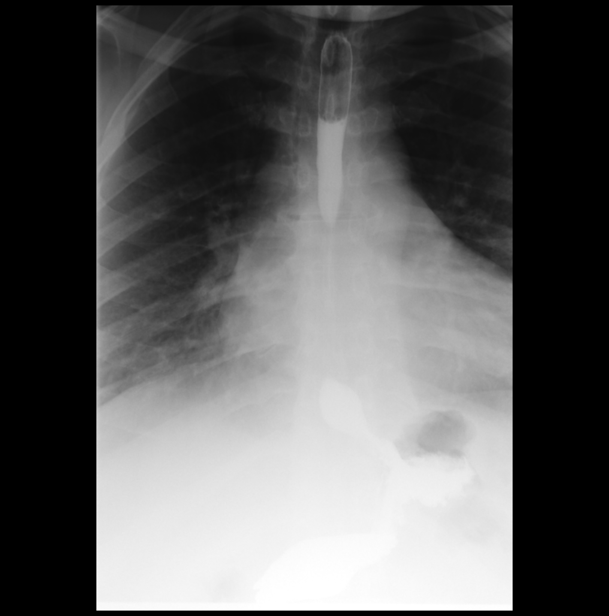

[Series 8: cp_standard · 0.26mm/px · 1 of 1 slices shown (3 of 10)]
[im 1/1]
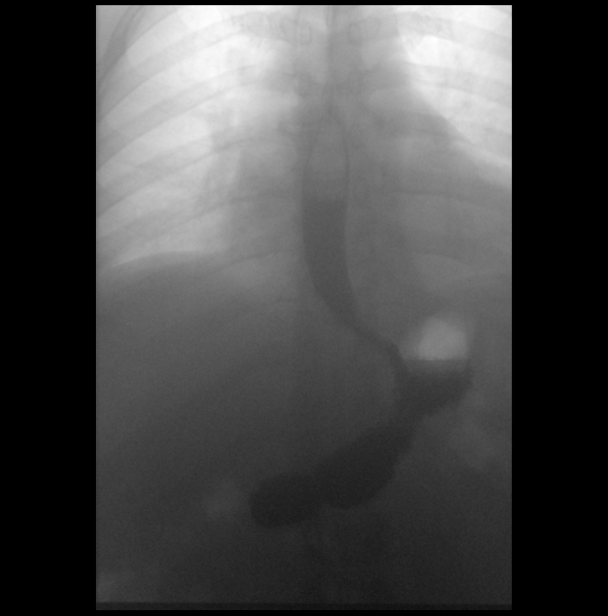

[Series 9: cp_standard · 0.26mm/px · 1 of 1 slices shown (4 of 10)]
[im 1/1]
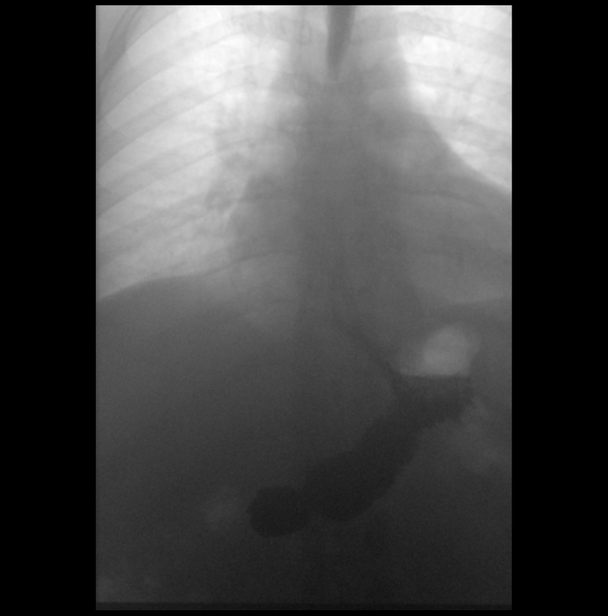

[Series 10: fluoro_barium 2fps_bw · 0.17mm/px · 1 of 7 frames shown (2 of 4)]
[frame 4/7]
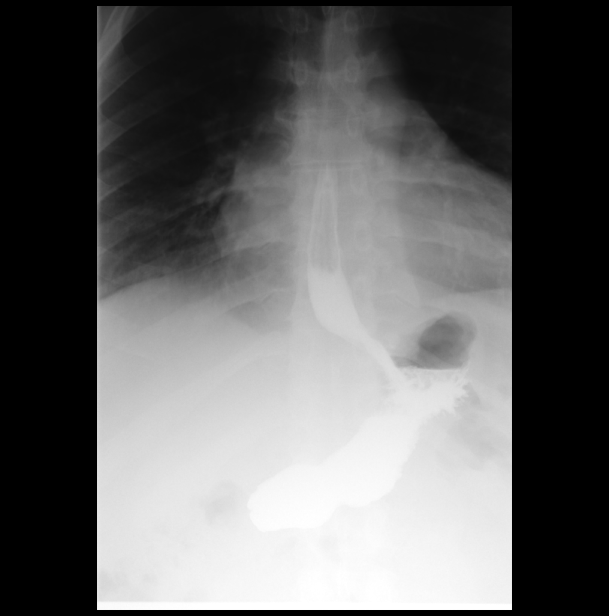

[Series 12: cp_standard · 0.26mm/px · 1 of 1 slices shown (5 of 10)]
[im 1/1]
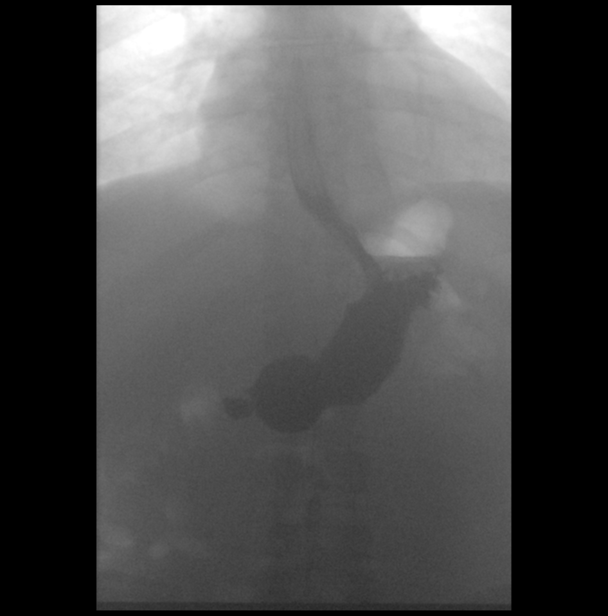

[Series 13: cp_standard · 0.26mm/px · 1 of 1 slices shown (6 of 10)]
[im 1/1]
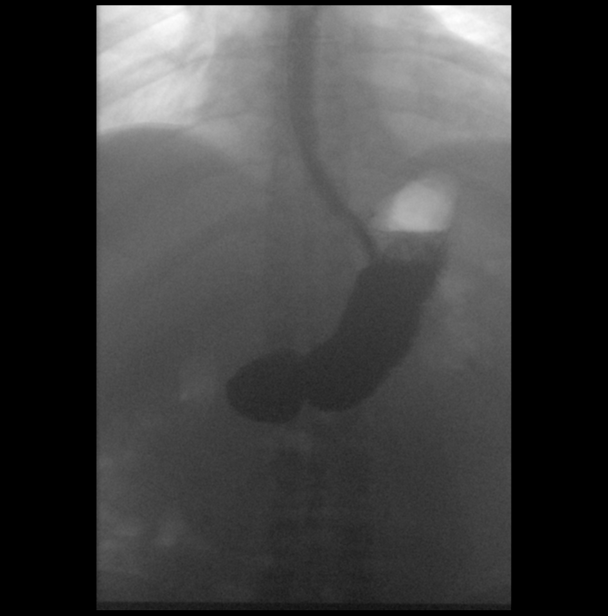

[Series 15: cp_standard · 0.26mm/px · 1 of 1 slices shown (7 of 10)]
[im 1/1]
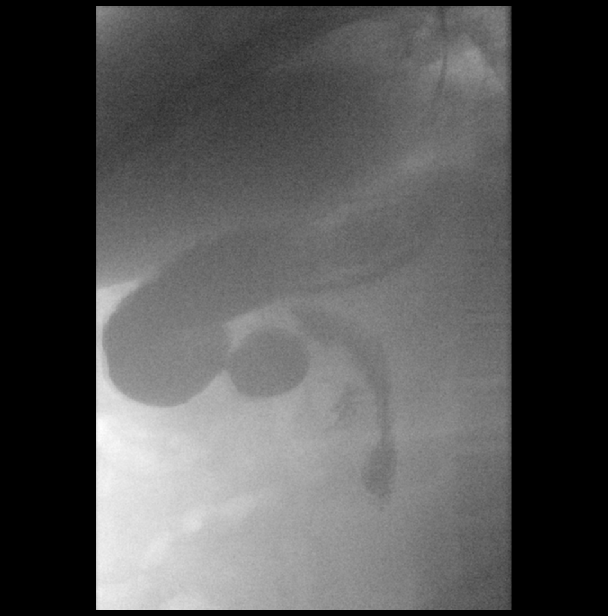

[Series 16: fluoro_barium 2fps_bw · 0.18mm/px · 1 of 1 slices shown (3 of 4)]
[im 1/1]
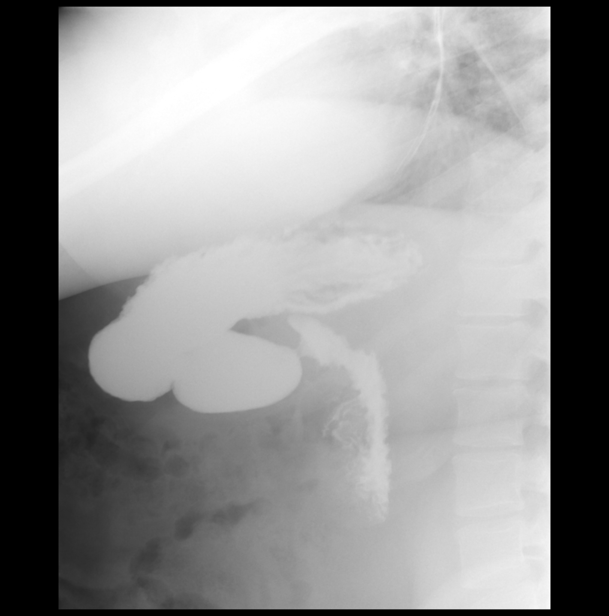

[Series 18: cp_standard · 0.27mm/px · 1 of 1 slices shown (8 of 10)]
[im 1/1]
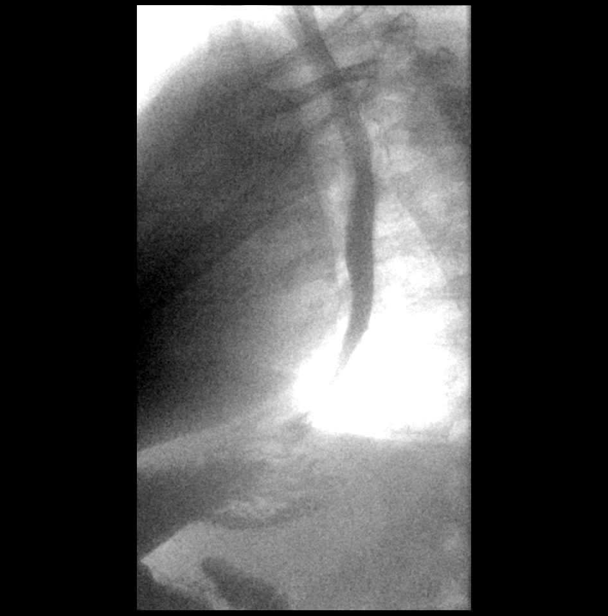

[Series 21: cp_standard · 0.27mm/px · 1 of 1 slices shown (9 of 10)]
[im 1/1]
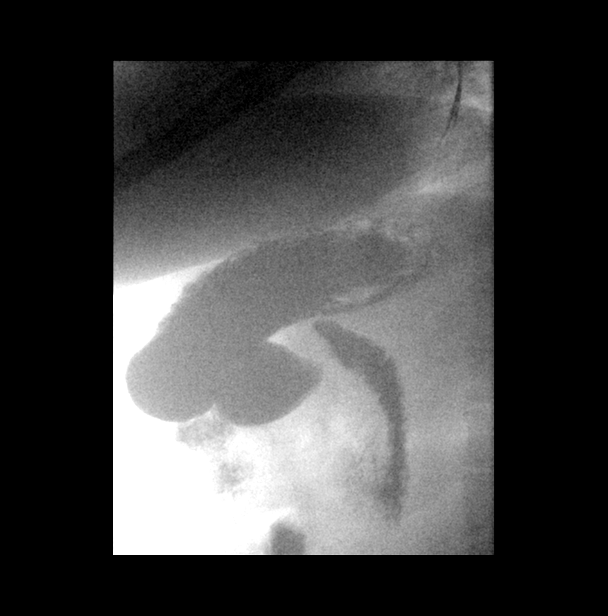

[Series 22: cp_standard · 0.27mm/px · 1 of 1 slices shown (10 of 10)]
[im 1/1]
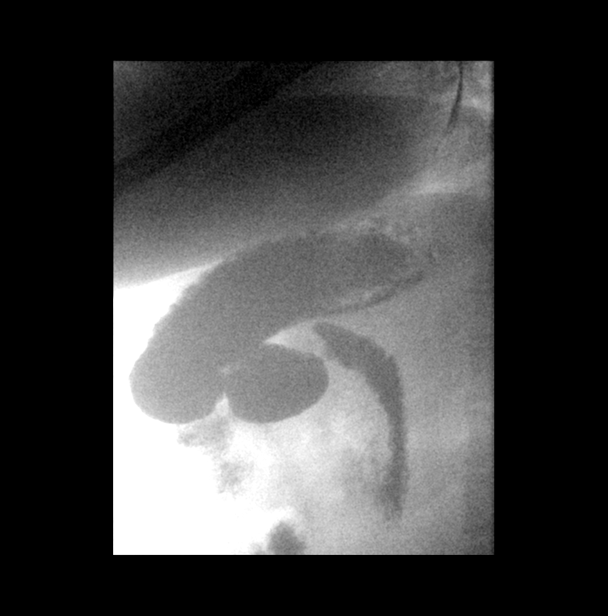

[Series 24: fluoro_barium 2fps_bw · 0.17mm/px · 1 of 2 frames shown (4 of 4)]
[frame 2/2]
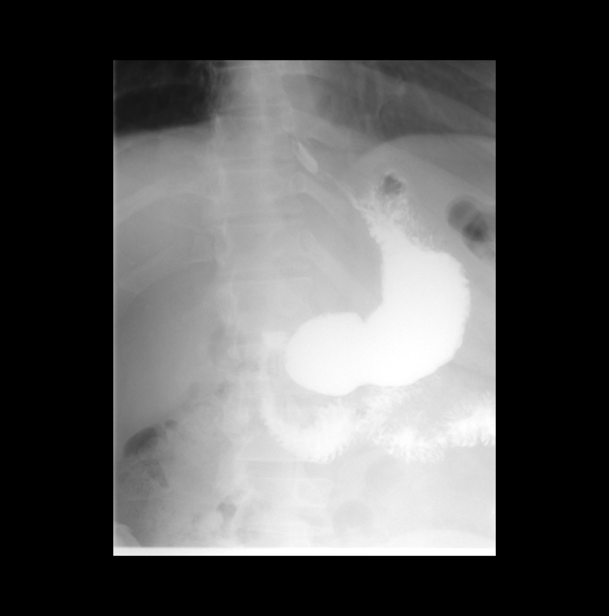

[15 of 24 positions shown; findings below may reference images not displayed]

FINDINGS: Normal primary esophageal stripping wave without evidence of
esophageal obstructing or constricting lesion. No mucosal
abnormality detected. No reflux demonstrated with change of patient
position.

Gastric contour within normal limits without evidence of mass or
ulceration. Duodenal bulb within normal limits without ulceration.
Proximally visualized small bowel unremarkable.
IMPRESSION: Single contrast upper GI series within normal limits.

## 2018-11-24 MED FILL — PROAIR HFA 90 MCG INHALER: 108 (90 BAS | 17 days supply | Qty: 9 | Fill #0

## 2018-11-24 MED FILL — predniSONE 20 MG TABS: 20 | 7 days supply | Qty: 14 | Fill #0

## 2018-12-06 DIAGNOSIS — J302 Other seasonal allergic rhinitis: Secondary | ICD-10-CM | POA: Diagnosis not present

## 2018-12-06 DIAGNOSIS — N529 Male erectile dysfunction, unspecified: Secondary | ICD-10-CM | POA: Diagnosis not present

## 2018-12-08 MED FILL — TADALAFIL 5 MG TABS: 5 | 30 days supply | Qty: 60 | Fill #0

## 2018-12-08 MED FILL — CLINDAMYCIN HCL 300 MG CAPS: 300 | 7 days supply | Qty: 21 | Fill #0

## 2019-01-02 DIAGNOSIS — J3489 Other specified disorders of nose and nasal sinuses: Secondary | ICD-10-CM | POA: Diagnosis not present

## 2019-01-09 MED FILL — HYDROCODON-APAP 5-325: 5-325 | 2 days supply | Qty: 12 | Fill #0

## 2019-01-12 MED FILL — MOXIFLOXACIN HCL 400 MG TAB: 400 | 7 days supply | Qty: 7 | Fill #0

## 2019-04-21 DIAGNOSIS — H52223 Regular astigmatism, bilateral: Secondary | ICD-10-CM | POA: Diagnosis not present

## 2019-04-24 DIAGNOSIS — R0981 Nasal congestion: Secondary | ICD-10-CM | POA: Diagnosis not present

## 2019-04-24 DIAGNOSIS — N529 Male erectile dysfunction, unspecified: Secondary | ICD-10-CM | POA: Diagnosis not present

## 2019-04-24 MED FILL — TADALAFIL 10 MG TABS: 10 | 90 days supply | Qty: 18 | Fill #0

## 2019-06-04 MED FILL — TADALAFIL 10 MG TABS: 10 | 20 days supply | Qty: 20 | Fill #1

## 2019-06-04 MED FILL — TADALAFIL 10 MG TABS: 10 | 90 days supply | Qty: 18 | Fill #0

## 2019-07-09 ENCOUNTER — Encounter (HOSPITAL_COMMUNITY): Payer: Self-pay

## 2019-08-31 DIAGNOSIS — Z20822 Contact with and (suspected) exposure to covid-19: Secondary | ICD-10-CM | POA: Diagnosis not present

## 2019-09-12 DIAGNOSIS — R350 Frequency of micturition: Secondary | ICD-10-CM | POA: Diagnosis not present

## 2019-09-14 ENCOUNTER — Ambulatory Visit
Admission: RE | Admit: 2019-09-14 | Discharge: 2019-09-14 | Disposition: A | Discharge status: Home / Self Care | Payer: 59 | Source: Ambulatory Visit | Attending: Internal Medicine | Admitting: Internal Medicine

## 2019-09-14 ENCOUNTER — Other Ambulatory Visit: Payer: Self-pay | Admitting: Internal Medicine

## 2019-09-14 DIAGNOSIS — R109 Unspecified abdominal pain: Secondary | ICD-10-CM | POA: Diagnosis not present

## 2019-09-14 DIAGNOSIS — K5732 Diverticulitis of large intestine without perforation or abscess without bleeding: Secondary | ICD-10-CM | POA: Diagnosis not present

## 2019-09-14 DIAGNOSIS — R1084 Generalized abdominal pain: Secondary | ICD-10-CM

## 2019-09-14 DIAGNOSIS — R102 Pelvic and perineal pain: Secondary | ICD-10-CM | POA: Diagnosis not present

## 2019-09-14 MED ORDER — IOPAMIDOL (ISOVUE-300) INJECTION 61%
125.0000 mL | Freq: Once | INTRAVENOUS | Status: AC | PRN
  Administered 2019-09-14: 125 mL via INTRAVENOUS

## 2019-09-18 DIAGNOSIS — K5730 Diverticulosis of large intestine without perforation or abscess without bleeding: Secondary | ICD-10-CM | POA: Diagnosis not present

## 2019-09-26 ENCOUNTER — Other Ambulatory Visit: Payer: Self-pay

## 2019-09-26 ENCOUNTER — Encounter (HOSPITAL_COMMUNITY): Payer: Self-pay | Admitting: Emergency Medicine

## 2019-09-26 ENCOUNTER — Emergency Department (HOSPITAL_COMMUNITY): Payer: 59

## 2019-09-26 ENCOUNTER — Emergency Department (HOSPITAL_COMMUNITY)
Admission: EM | Admit: 2019-09-26 | Discharge: 2019-09-27 | Disposition: A | Discharge status: Home / Self Care | Payer: 59 | Attending: Emergency Medicine | Admitting: Emergency Medicine

## 2019-09-26 DIAGNOSIS — R0789 Other chest pain: Secondary | ICD-10-CM | POA: Diagnosis not present

## 2019-09-26 DIAGNOSIS — R110 Nausea: Secondary | ICD-10-CM | POA: Insufficient documentation

## 2019-09-26 DIAGNOSIS — R0602 Shortness of breath: Secondary | ICD-10-CM | POA: Diagnosis not present

## 2019-09-26 DIAGNOSIS — Z79899 Other long term (current) drug therapy: Secondary | ICD-10-CM | POA: Insufficient documentation

## 2019-09-26 DIAGNOSIS — R202 Paresthesia of skin: Secondary | ICD-10-CM | POA: Insufficient documentation

## 2019-09-26 DIAGNOSIS — M79602 Pain in left arm: Secondary | ICD-10-CM | POA: Diagnosis not present

## 2019-09-26 DIAGNOSIS — Z8709 Personal history of other diseases of the respiratory system: Secondary | ICD-10-CM | POA: Diagnosis not present

## 2019-09-26 DIAGNOSIS — R079 Chest pain, unspecified: Secondary | ICD-10-CM | POA: Diagnosis not present

## 2019-09-26 DIAGNOSIS — R002 Palpitations: Secondary | ICD-10-CM | POA: Insufficient documentation

## 2019-09-26 LAB — CBC
HCT: 43.4 % (ref 39.0–52.0)
Hemoglobin: 14.3 g/dL (ref 13.0–17.0)
MCH: 28.7 pg (ref 26.0–34.0)
MCHC: 32.9 g/dL (ref 30.0–36.0)
MCV: 87 fL (ref 80.0–100.0)
Platelets: 258 10*3/uL (ref 150–400)
RBC: 4.99 MIL/uL (ref 4.22–5.81)
RDW: 12.3 % (ref 11.5–15.5)
WBC: 6.8 10*3/uL (ref 4.0–10.5)
nRBC: 0 % (ref 0.0–0.2)

## 2019-09-26 LAB — BASIC METABOLIC PANEL
Anion gap: 10 (ref 5–15)
BUN: 16 mg/dL (ref 6–20)
CO2: 24 mmol/L (ref 22–32)
Calcium: 9.4 mg/dL (ref 8.9–10.3)
Chloride: 103 mmol/L (ref 98–111)
Creatinine, Ser: 0.92 mg/dL (ref 0.61–1.24)
GFR calc Af Amer: >60 mL/min (ref >60)
GFR calc non Af Amer: >60 mL/min (ref >60)
Glucose, Bld: 93 mg/dL (ref 70–99)
Potassium: 3.8 mmol/L (ref 3.5–5.1)
Sodium: 137 mmol/L (ref 135–145)

## 2019-09-26 LAB — PROTIME-INR
INR: 1 (ref 0.8–1.2)
Prothrombin Time: 13.1 seconds (ref 11.4–15.2)

## 2019-09-26 LAB — TROPONIN I (HIGH SENSITIVITY): Troponin I (High Sensitivity): 3 ng/L (ref <18)

## 2019-09-26 MED ORDER — ALUM & MAG HYDROXIDE-SIMETH 200-200-20 MG/5ML PO SUSP
30.0000 mL | Freq: Once | ORAL | Status: DC
  Filled 2019-09-26: qty 30

## 2019-09-26 MED ORDER — SODIUM CHLORIDE 0.9% FLUSH
3.0000 mL | Freq: Once | INTRAVENOUS | Status: AC
  Administered 2019-09-27: 3 mL via INTRAVENOUS

## 2019-09-26 MED ORDER — LIDOCAINE VISCOUS HCL 2 % MT SOLN
15.0000 mL | Freq: Once | OROMUCOSAL | Status: DC
  Filled 2019-09-26: qty 15

## 2019-09-26 MED ORDER — ONDANSETRON HCL 4 MG/2ML IJ SOLN
4.0000 mg | Freq: Once | INTRAMUSCULAR | Status: AC
  Administered 2019-09-27: 4 mg via INTRAVENOUS
  Filled 2019-09-26: qty 2

## 2019-09-26 MED ORDER — ASPIRIN 81 MG PO CHEW
324.0000 mg | CHEWABLE_TABLET | Freq: Once | ORAL | Status: AC
  Administered 2019-09-27: 324 mg via ORAL
  Filled 2019-09-26: qty 4

## 2019-09-26 MED ORDER — FENTANYL CITRATE (PF) 100 MCG/2ML IJ SOLN
50.0000 ug | Freq: Once | INTRAMUSCULAR | Status: AC
  Administered 2019-09-27: 50 ug via INTRAVENOUS
  Filled 2019-09-26: qty 2

## 2019-09-26 NOTE — ED Notes (Signed)
PA states to hold on ordered medications at this time.  She is at bedside assessing the patient.

## 2019-09-26 NOTE — ED Provider Notes (Signed)
[facility] EMERGENCY DEPARTMENT Provider Note   CSN: [id number] Arrival date & time: 09/26/19  2217     History Chief Complaint  Patient presents with  . Chest Pain    [name] is a 39 y.o. male who presents emergency department with a chief complaint of chest pain.  Patient states that his pain began around 2:-3 in the morning.  He describes it as pressure-like, worse with inspiration.  He states that at first it was intermittent but then around 3 PM this afternoon it became constant and has progressively worsened.  He began having pain radiating down the left arm with some paresthesia in the left hand.  He has associated palpitations, shortness of breath and associated sensation of racing in his heart.  Patient had about 20 minutes of diaphoresis and has persistent nausea.  He rates his pain as severe.  Patient did notice yesterday that his left lower extremity was a bit more swollen because when he pulled some boots on the left side felt tighter.  He denies history of DVT, PE, recent confinement or surgeries.  He has no history of cancer.  Patient has a history of obesity and hypercholesterolemia.  He does not take medications.  He has recent diagnosis of diverticulitis and did not complete his course of antibiotics.  Patient states he has a history of reflux and states this does not feel the same.  He has never had anything like this.  He has no primary relatives with a history of CAD or heart attack.  He denies a history of hypertension, smoking, alcohol or drug use.  He denies any recent heavy lifting.  HPI  HPI: A 39 year old patient with a history of hypercholesterolemia and obesity presents for evaluation of chest pain. Initial onset of pain was less than one hour ago. The patient's chest pain is not worse with exertion. The patient complains of nausea and reports some diaphoresis. The patient's chest pain is middle- or left-sided, is not well-localized, is not  described as heaviness/pressure/tightness, is not sharp and does not radiate to the arms/jaw/neck. The patient has no history of stroke, has no history of peripheral artery disease, has not smoked in the past 90 days, denies any history of treated diabetes, has no relevant family history of coronary artery disease (first degree relative at less than age 52) and is not hypertensive.   Past Medical History:  Diagnosis Date  . Allergy   . Asthma    inhaler as needed  . Esophagitis   . GERD (gastroesophageal reflux disease)   . Hypercholesterolemia   . Obesity   . Sleep apnea    cpap    Patient Active Problem List   Diagnosis Date Noted  . OSA on CPAP 01/17/2017  . Morbid obesity ([place]) 01/17/2017  . Hypercholesterolemia   . GASTRITIS 12/18/2008  . GERD 12/09/2008  . NAUSEA WITH VOMITING 12/09/2008    Past Surgical History:  Procedure Laterality Date  . GASTRIC ROUX-EN-Y N/A 01/17/2017   Procedure: LAPAROSCOPIC ROUX-EN-Y GASTRIC BYPASS WITH UPPER ENDOSCOPY;  Surgeon: Greer Pickerel, MD;  Location: [facility];  Service: General;  Laterality: N/A;  . KNEE ARTHROSCOPY    . WISDOM TOOTH EXTRACTION         Family History  Problem Relation Age of Onset  . Breast cancer Mother   . Diabetes Other   . Lymphoma Other   . Colon cancer Maternal Grandfather   . Esophageal cancer Neg Hx   .  Stomach cancer Neg Hx     Social History   Tobacco Use  . Smoking status: Never Smoker  . Smokeless tobacco: Never Used  Substance Use Topics  . Alcohol use: No  . Drug use: No    Home Medications Prior to Admission medications   Medication Sig Start Date End Date Taking? Authorizing Provider  albuterol (PROVENTIL HFA;VENTOLIN HFA) 108 (90 Base) MCG/ACT inhaler Inhale 2 puffs into the lungs every 6 (six) hours as needed for wheezing or shortness of breath. 08/11/16  Yes Lucia Estelle, NP  Calcium-Vitamin D-Vitamin K 500-100-40 MG-UNT-MCG CHEW Chew 1 tablet by mouth daily.   Yes [provider]  ciprofloxacin (CIPRO) 500 MG tablet Take 1 tablet by mouth 2 (two) times daily. 09/14/19  Yes [provider]  loratadine (CLARITIN) 10 MG tablet Take 10 mg by mouth daily as needed for allergies.   Yes [provider]  metroNIDAZOLE (FLAGYL) 500 MG tablet Take 500 mg by mouth 3 (three) times daily. 09/14/19  Yes [provider]  Multiple Vitamins-Minerals (BARIATRIC FUSION PO) Take 1 capsule by mouth daily.   Yes [provider]    Allergies    Amoxicillin  Review of Systems   Review of Systems Ten systems reviewed and are negative for acute change, except as noted in the HPI.   Physical Exam Updated Vital Signs BP 105/71   Pulse 62   Temp 97.9 F (36.6 C) (Oral)   Resp 15   SpO2 97%   Physical Exam Vitals and nursing note reviewed.  Constitutional:      General: He is not in acute distress.    Appearance: He is well-developed. He is not diaphoretic.  HENT:     Head: Normocephalic and atraumatic.  Eyes:     General: No scleral icterus.    Conjunctiva/sclera: Conjunctivae normal.  Cardiovascular:     Rate and Rhythm: Normal rate and regular rhythm.     Heart sounds: Normal heart sounds.  Pulmonary:     Effort: Pulmonary effort is normal. No tachypnea or respiratory distress.     Breath sounds: Normal breath sounds.  Abdominal:     Palpations: Abdomen is soft.     Tenderness: There is no abdominal tenderness.  Musculoskeletal:     Cervical back: Normal range of motion and neck supple.     Comments: No appreciable leg swelling, no pitting edema  Skin:    General: Skin is warm and dry.  Neurological:     Mental Status: He is alert.  Psychiatric:        Behavior: Behavior normal.     ED Results / Procedures / Treatments   Labs (all labs ordered are listed, but only abnormal results are displayed) Labs Reviewed  BASIC METABOLIC PANEL  CBC  PROTIME-INR  D-DIMER, QUANTITATIVE (NOT AT [facility])  TROPONIN I (HIGH SENSITIVITY)    TROPONIN I (HIGH SENSITIVITY)    EKG EKG Interpretation  Date/Time:  Wednesday September 26 2019 22:21:30 EST Ventricular Rate:  77 PR Interval:  190 QRS Duration: 84 QT Interval:  364 QTC Calculation: 411 R Axis:   74 Text Interpretation: Normal sinus rhythm Nonspecific T wave abnormality no change from previous Confirmed by Nicanor Alcon, April ([id number]) on 09/26/2019 11:27:29 PM   Radiology DG Chest 2 View  Result Date: 09/26/2019 CLINICAL DATA:  Central chest pain and shortness of breath, nausea, pain with inspiration EXAM: CHEST - 2 VIEW COMPARISON:  11/23/2016 FINDINGS: The heart size and mediastinal contours are within  normal limits. Both lungs are clear. The visualized skeletal structures are unremarkable. IMPRESSION: No active cardiopulmonary disease. Electronically Signed   By: Sharlet Salina M.D.   On: 09/26/2019 22:50    Procedures Procedures (including critical care time)  Medications Ordered in ED Medications  sodium chloride flush (NS) 0.9 % injection 3 mL (3 mLs Intravenous Given 09/27/19 0014)  aspirin chewable tablet 324 mg (324 mg Oral Given 09/27/19 0011)  fentaNYL (SUBLIMAZE) injection 50 mcg (50 mcg Intravenous Given 09/27/19 0013)  ondansetron (ZOFRAN) injection 4 mg (4 mg Intravenous Given 09/27/19 0011)    ED Course  I have reviewed the triage vital signs and the nursing notes.  Pertinent labs & imaging results that were available during my care of the patient were reviewed by me and considered in my medical decision making (see chart for details).    MDM Rules/Calculators/A&P HEAR Score: 3                    CC: chest pain VS:  Vitals:   09/26/19 2325 09/27/19 0000 09/27/19 0030 09/27/19 0100  BP: 124/83 108/66 118/73 105/71  Pulse: 79 67 73 62  Resp: (!) 27 16 17 15   Temp:      TempSrc:      SpO2: 99% 96% 98% 97%   39 y/o M with hx of obesity, gastric bypass, HLD- untreated. HEART score of 3.  I personally reviewed labs which show 2 negative  serial troponins, negative D-dimer, BMP, bmp, and pt/INR WNL.   EKG shows NSR R-77, T wave abnormalities, and no changes from previous tracings. I have reviewed the chest xray which shows Normal heart size and lung field and does not appear to show ptx, mass, edema on my interpretation.   Given the large differential diagnosis for BRICE KOSSMAN, the decision making in this case is of high complexity.  After evaluating all of the data points in this case, the presentation of BREXTON SOFIA is NOT consistent with Acute Coronary Syndrome (ACS) and/or myocardial ischemia, pulmonary embolism, aortic dissection; Borhaave's, significant arrythmia, pneumothorax, cardiac tamponade, or other emergent cardiopulmonary condition.  Further, the presentation of CALE BETHARD is NOT consistent with pericarditis, myocarditis, cholecystitis, pancreatitis, mediastinitis, endocarditis, new valvular disease.  Additionally, the presentation of CASTLE LAMONS NOT consistent with flail chest, cardiac contusion, ARDS, or significant intra-thoracic or intra-abdominal bleeding.  Moreover, this presentation is NOT consistent with pneumonia.  Strict return and follow-up precautions have been given by me personally or by detailed written instruction given verbally by nursing staff using the teach back method to the patient/family/caregiver(s).  Data Reviewed/Counseling: I have reviewed the patient's vital signs, nursing notes, and other relevant tests/information. I had a detailed discussion regarding the historical points, exam findings, and any diagnostic results supporting the discharge diagnosis. I also discussed the need for outpatient follow-up and the need to return to the ED if symptoms worsen or if there are any questions or concerns that arise at home.  Final Clinical Impression(s) / ED Diagnoses Final diagnoses:  Atypical chest pain    Rx / DC Orders ED Discharge Orders    None       Domingo Pulse,  PA-C 09/27/19 1913    Palumbo, April, MD 09/27/19 2309

## 2019-09-26 NOTE — ED Triage Notes (Signed)
Patient reports central chest pain with SOB and nausea onset today , denies emesis or diaphoresis , no cough or fever . Pain increases with deep inspiration .

## 2019-09-27 DIAGNOSIS — R0789 Other chest pain: Secondary | ICD-10-CM | POA: Diagnosis not present

## 2019-09-27 DIAGNOSIS — Z79899 Other long term (current) drug therapy: Secondary | ICD-10-CM | POA: Diagnosis not present

## 2019-09-27 DIAGNOSIS — R0602 Shortness of breath: Secondary | ICD-10-CM | POA: Diagnosis not present

## 2019-09-27 DIAGNOSIS — R002 Palpitations: Secondary | ICD-10-CM | POA: Diagnosis not present

## 2019-09-27 DIAGNOSIS — M79602 Pain in left arm: Secondary | ICD-10-CM | POA: Diagnosis not present

## 2019-09-27 DIAGNOSIS — R202 Paresthesia of skin: Secondary | ICD-10-CM | POA: Diagnosis not present

## 2019-09-27 DIAGNOSIS — Z8709 Personal history of other diseases of the respiratory system: Secondary | ICD-10-CM | POA: Diagnosis not present

## 2019-09-27 DIAGNOSIS — R110 Nausea: Secondary | ICD-10-CM | POA: Diagnosis not present

## 2019-09-27 LAB — D-DIMER, QUANTITATIVE: D-Dimer, Quant: <0.27 ug/mL-FEU (ref 0.00–0.50)

## 2019-09-27 LAB — TROPONIN I (HIGH SENSITIVITY): Troponin I (High Sensitivity): 3 ng/L (ref <18)

## 2019-09-27 NOTE — Discharge Instructions (Signed)
Please schedule an appointment for provocative testing Your caregiver has diagnosed you as having chest pain that is not specific for one problem, but does not require admission.  You are at low risk for an acute heart condition or other serious illness. Chest pain comes from many different causes.  SEEK IMMEDIATE MEDICAL ATTENTION IF: You have severe chest pain, especially if the pain is crushing or pressure-like and spreads to the arms, back, neck, or jaw, or if you have sweating, nausea (feeling sick to your stomach), or shortness of breath. THIS IS AN EMERGENCY. Don't wait to see if the pain will go away. Get medical help at once. Call 911 or 0 (operator). DO NOT drive yourself to the hospital.  Your chest pain gets worse and does not go away with rest.  You have an attack of chest pain lasting longer than usual, despite rest and treatment with the medications your caregiver has prescribed.  You wake from sleep with chest pain or shortness of breath.  You feel dizzy or faint.  You have chest pain not typical of your usual pain for which you originally saw your caregiver.

## 2019-10-02 DIAGNOSIS — J302 Other seasonal allergic rhinitis: Secondary | ICD-10-CM | POA: Diagnosis not present

## 2019-10-02 DIAGNOSIS — E7800 Pure hypercholesterolemia, unspecified: Secondary | ICD-10-CM | POA: Diagnosis not present

## 2019-10-02 DIAGNOSIS — R079 Chest pain, unspecified: Secondary | ICD-10-CM | POA: Diagnosis not present

## 2019-10-02 DIAGNOSIS — Z0000 Encounter for general adult medical examination without abnormal findings: Secondary | ICD-10-CM | POA: Diagnosis not present

## 2019-10-02 DIAGNOSIS — K219 Gastro-esophageal reflux disease without esophagitis: Secondary | ICD-10-CM | POA: Diagnosis not present

## 2019-10-02 DIAGNOSIS — G4733 Obstructive sleep apnea (adult) (pediatric): Secondary | ICD-10-CM | POA: Diagnosis not present

## 2019-10-08 MED FILL — ATORVASTATIN 10 MG TABLET: 10 | 90 days supply | Qty: 90 | Fill #0

## 2019-10-17 DIAGNOSIS — K5732 Diverticulitis of large intestine without perforation or abscess without bleeding: Secondary | ICD-10-CM | POA: Diagnosis not present

## 2019-10-17 DIAGNOSIS — Z9884 Bariatric surgery status: Secondary | ICD-10-CM | POA: Diagnosis not present

## 2019-10-17 DIAGNOSIS — K219 Gastro-esophageal reflux disease without esophagitis: Secondary | ICD-10-CM | POA: Diagnosis not present

## 2019-10-25 ENCOUNTER — Ambulatory Visit: Payer: 59 | Attending: Family

## 2019-10-25 DIAGNOSIS — Z23 Encounter for immunization: Secondary | ICD-10-CM

## 2019-10-25 NOTE — Progress Notes (Signed)
   Covid-19 Vaccination Clinic  Name:  [name]    MRN: [id number] DOB: Feb 08, 1981  10/25/2019  [name] was observed post Covid-19 immunization for 15 minutes without incident. He was provided with Vaccine Information Sheet and instruction to access the V-Safe system.   [name] was instructed to call 911 with any severe reactions post vaccine: [facility] Difficulty breathing  . Swelling of face and throat  . A fast heartbeat  . A bad rash all over body  . Dizziness and weakness   Immunizations Administered    Name Date Dose VIS Date Route   Moderna COVID-19 Vaccine 10/25/2019 10:04 AM 0.5 mL 07/17/2019 Intramuscular   Manufacturer: Moderna   Lot: [id number]   NDC: 23300-762-26

## 2019-11-05 ENCOUNTER — Ambulatory Visit: Payer: 59 | Admitting: Interventional Cardiology

## 2019-11-27 ENCOUNTER — Ambulatory Visit: Payer: 59

## 2019-12-04 ENCOUNTER — Ambulatory Visit: Payer: 59 | Attending: Family

## 2019-12-04 DIAGNOSIS — Z23 Encounter for immunization: Secondary | ICD-10-CM

## 2019-12-04 NOTE — Progress Notes (Signed)
   Covid-19 Vaccination Clinic  Name:  [name]    MRN: [id number] DOB: 1981-02-12  12/04/2019  Mr. Kallam was observed post Covid-19 immunization for 15 minutes without incident. He was provided with Vaccine Information Sheet and instruction to access the V-Safe system.   Mr. Dimmer was instructed to call 911 with any severe reactions post vaccine: [facility]land Kitchen Difficulty breathing  . Swelling of face and throat  . A fast heartbeat  . A bad rash all over body  . Dizziness and weakness   Immunizations Administered    Name Date Dose VIS Date Route   Moderna COVID-19 Vaccine 12/04/2019 12:21 PM 0.5 mL 07/2019 Intramuscular   Manufacturer: Moderna   Lot: [id number]   NDC: 40768-088-11

## 2019-12-10 NOTE — Progress Notes (Signed)
Cardiology Office Note:    Date:  12/11/2019   ID:  [name], DOB 01-20-1981, MRN [id number]  PCP:  Jesse Carol, MD  Cardiologist:  No primary care provider on file.   Referring MD: Jesse Carol, MD   Chief Complaint  Patient presents with  . Chest Pain    History of Present Illness:    [name] is a 39 y.o. male with a hx of chest pain referred for evaluation.  [name] has had a 2 to 3-year history of intermittent pleuritic quality chest discomfort that when it occurs can last 36 to 48 hours before resolving.  Has had 2 or 3 emergency room visits for the discomfort without finding acute ischemic injury or other problems.  The discomfort is not exertional related, intensity is positional, he is unable to take a deep breath when it is present, it is very intense and at times associated with diaphoresis.  The most recent episode occurred September 26, 2019.  Past Medical History:  Diagnosis Date  . Allergy   . Asthma    inhaler as needed  . Esophagitis   . GASTRITIS 12/18/2008   Qualifier: Diagnosis of  By: Arsenio Loader RN, Ramond Craver    . GERD 12/09/2008   Qualifier: Diagnosis of  By: Marland Mcalpine    . GERD (gastroesophageal reflux disease)   . Hypercholesterolemia   . Morbid obesity ([place]) 01/17/2017  . Nausea with vomiting 12/09/2008   Qualifier: Diagnosis of  By: Marland Mcalpine    . Obesity   . OSA on CPAP 01/17/2017  . Sleep apnea    cpap    Past Surgical History:  Procedure Laterality Date  . GASTRIC ROUX-EN-Y N/A 01/17/2017   Procedure: LAPAROSCOPIC ROUX-EN-Y GASTRIC BYPASS WITH UPPER ENDOSCOPY;  Surgeon: Greer Pickerel, MD;  Location: [facility];  Service: General;  Laterality: N/A;  . KNEE ARTHROSCOPY    . WISDOM TOOTH EXTRACTION      Current Medications: Current Meds  Medication Sig  . atorvastatin (LIPITOR) 10 MG tablet Take 10 mg by mouth daily.  . Calcium-Vitamin D-Vitamin K 500-100-40 MG-UNT-MCG CHEW Chew 1 tablet by mouth daily.  Marland Kitchen loratadine  (CLARITIN) 10 MG tablet Take 10 mg by mouth daily as needed for allergies.  . Multiple Vitamins-Minerals (BARIATRIC FUSION PO) Take 1 capsule by mouth daily.  . tadalafil (CIALIS) 10 MG tablet Take 10 mg by mouth as needed.  . traMADol (ULTRAM) 50 MG tablet Take 50 mg by mouth as needed.     Allergies:   Amoxicillin   Social History   Socioeconomic History  . Marital status: Married    Spouse name: Not on file  . Number of children: Not on file  . Years of education: Not on file  . Highest education level: Not on file  Occupational History  . Occupation: Engineer, structural  . Occupation: Pastor  Tobacco Use  . Smoking status: Light Tobacco Smoker    Types: Cigars  . Smokeless tobacco: Never Used  . Tobacco comment: Occasionally  Substance and Sexual Activity  . Alcohol use: No  . Drug use: No  . Sexual activity: Yes  Other Topics Concern  . Not on file  Social History Narrative   Daily caffeine   Social Determinants of Health   Financial Resource Strain:   . Difficulty of Paying Living Expenses:   Food Insecurity:   . Worried About Charity fundraiser in the Last Year:   . [employer]  in the Last Year:   Transportation Needs:   . Freight forwarder (Medical):   Marland Kitchen Lack of Transportation (Non-Medical):   Physical Activity:   . Days of Exercise per Week:   . Minutes of Exercise per Session:   Stress:   . Feeling of Stress :   Social Connections:   . Frequency of Communication with Friends and Family:   . Frequency of Social Gatherings with Friends and Family:   . Attends Religious Services:   . Active Member of Clubs or Organizations:   . Attends Banker Meetings:   Marland Kitchen Marital Status:      Family History: The patient's family history includes Colon cancer in his maternal grandfather; Diabetes in an other family member; Hodgkin's lymphoma in his mother; Lymphoma in an other family member. There is no history of Esophageal cancer or Stomach  cancer.  ROS:   Please see the history of present illness.    Previously weighed 390 pounds, at bariatric surgery and lost 170 pounds.  During the pandemic he has gained back 60 pounds.  The chest discomfort mentioned before was happening before he had bariatric surgery.  Recent diagnosis of diverticulitis.  "Sleep apnea before bariatric surgery.  Not sleeping well now since he has gained weight.  All other systems reviewed and are negative.  EKGs/Labs/Other Studies Reviewed:    The following studies were reviewed today: Abdominal and chest CT imaging have not not revealed evidence of atherosclerosis.  EKG:  EKG performed on 09/26/2019 demonstrated nondiagnostic inferolateral Q waves, no ST segment abnormality, and overall normal EKG in appearance.  There was no variance when compared to prior tracings.  Recent Labs: 09/26/2019: BUN 16; Creatinine, Ser 0.92; Hemoglobin 14.3; Platelets 258; Potassium 3.8; Sodium 137  Recent Lipid Panel No results found for: CHOL, TRIG, HDL, CHOLHDL, VLDL, LDLCALC, LDLDIRECT  Physical Exam:    VS:  BP 98/68   Pulse 71   Ht 5\' 7"  (1.702 m)   Wt 296 lb 12.8 oz (134.6 kg)   SpO2 96%   BMI 46.49 kg/m     Wt Readings from Last 3 Encounters:  12/11/19 296 lb 12.8 oz (134.6 kg)  03/02/17 (!) 304 lb 6.4 oz (138.1 kg)  02/14/17 (!) 303 lb 6.4 oz (137.6 kg)     GEN: Obese.. No acute distress HEENT: Normal NECK: No JVD. LYMPHATICS: No lymphadenopathy CARDIAC:  RRR without murmur, gallop, or edema. VASCULAR:  Normal Pulses. No bruits. RESPIRATORY:  Clear to auscultation without rales, wheezing or rhonchi  ABDOMEN: Soft, non-tender, non-distended, No pulsatile mass, MUSCULOSKELETAL: No deformity  SKIN: Warm and dry NEUROLOGIC:  Alert and oriented x 3 PSYCHIATRIC:  Normal affect   ASSESSMENT:    1. Pleuritic chest pain   2. OSA on CPAP   3. Hypercholesterolemia   4. Chest pain of uncertain etiology   5. Morbid obesity (HCC)   6. Educated about  COVID-19 virus infection    PLAN:    In order of problems listed above:  1. He is possibly having recurring episodes of pericarditis which over a 3-year timeframe would need to be classified as recurrent pericarditis and would raise a question of an underlying systemic cause such as autoimmune disease or other.  We will get a CRP level and 2D Doppler echocardiogram.  He is currently asymptomatic and has no clinical findings. 2. He has regained weight and may have recurrent obstructive sleep apnea.  I have asked that he be mindful and if not sleeping  well consider restarting therapy. 3. We discussed elevated cholesterol and downstream risk of vascular obstructive disease.  Primary cardiac prevention is discussed.  He is having some difficulty tolerating statin therapy due to musculoskeletal syndrome. 4. See above 5. Encouraged returning to his diet and exercise program 6. Encouraged the COVID-19 vaccine and continued social distancing and mask wearing.  Overall education and awareness concerning primary/secondary risk prevention was discussed in detail: LDL less than 70, hemoglobin A1c less than 7, blood pressure target less than 130/80 mmHg, >150 minutes of moderate aerobic activity per week, avoidance of smoking, weight control (via diet and exercise), and continued surveillance/management of/for obstructive sleep apnea.      Medication Adjustments/Labs and Tests Ordered: Current medicines are reviewed at length with the patient today.  Concerns regarding medicines are outlined above.  Orders Placed This Encounter  Procedures  . C-reactive protein  . ECHOCARDIOGRAM COMPLETE   No orders of the defined types were placed in this encounter.   Patient Instructions  Medication Instructions:  Your physician recommends that you continue on your current medications as directed. Please refer to the Current Medication list given to you today.  *If you need a refill on your cardiac medications  before your next appointment, please call your pharmacy*   Lab Work: CRP today  If you have labs (blood work) drawn today and your tests are completely normal, you will receive your results only by: Marland Kitchen MyChart Message (if you have MyChart) OR . A paper copy in the mail If you have any lab test that is abnormal or we need to change your treatment, we will call you to review the results.   Testing/Procedures: Your physician has requested that you have an echocardiogram. Echocardiography is a painless test that uses sound waves to create images of your heart. It provides your doctor with information about the size and shape of your heart and how well your heart's chambers and valves are working. This procedure takes approximately one hour. There are no restrictions for this procedure.    Follow-Up: At [facility], you and your health needs are our priority.  As part of our continuing mission to provide you with exceptional heart care, we have created designated Provider Care Teams.  These Care Teams include your primary Cardiologist (physician) and Advanced Practice Providers (APPs -  Physician Assistants and Nurse Practitioners) who all work together to provide you with the care you need, when you need it.  We recommend signing up for the patient portal called "MyChart".  Sign up information is provided on this After Visit Summary.  MyChart is used to connect with patients for Virtual Visits (Telemedicine).  Patients are able to view lab/test results, encounter notes, upcoming appointments, etc.  Non-urgent messages can be sent to your provider as well.   To learn more about what you can do with MyChart, go to [email or web address].    Your next appointment:   As needed  The format for your next appointment:   In Person  Provider:   You may see Dr. Verdis Prime or one of the following Advanced Practice Providers on your designated Care Team:    Norma Fredrickson, NP  Nada Boozer,  NP  Georgie Chard, NP    Other Instructions      Signed, Lesleigh Noe, MD  12/11/2019 9:34 AM    [facility]

## 2019-12-11 ENCOUNTER — Other Ambulatory Visit: Payer: Self-pay

## 2019-12-11 ENCOUNTER — Ambulatory Visit: Payer: 59 | Admitting: Interventional Cardiology

## 2019-12-11 ENCOUNTER — Encounter: Payer: Self-pay | Admitting: Interventional Cardiology

## 2019-12-11 VITALS — BP 98/68 | HR 71 | Ht 67.0 in | Wt 296.8 lb

## 2019-12-11 DIAGNOSIS — E7800 Pure hypercholesterolemia, unspecified: Secondary | ICD-10-CM

## 2019-12-11 DIAGNOSIS — R079 Chest pain, unspecified: Secondary | ICD-10-CM

## 2019-12-11 DIAGNOSIS — R0781 Pleurodynia: Secondary | ICD-10-CM | POA: Diagnosis not present

## 2019-12-11 DIAGNOSIS — Z7189 Other specified counseling: Secondary | ICD-10-CM

## 2019-12-11 DIAGNOSIS — Z9989 Dependence on other enabling machines and devices: Secondary | ICD-10-CM | POA: Diagnosis not present

## 2019-12-11 DIAGNOSIS — G4733 Obstructive sleep apnea (adult) (pediatric): Secondary | ICD-10-CM | POA: Diagnosis not present

## 2019-12-11 NOTE — Patient Instructions (Signed)
Medication Instructions:  Your physician recommends that you continue on your current medications as directed. Please refer to the Current Medication list given to you today.  *If you need a refill on your cardiac medications before your next appointment, please call your pharmacy*   Lab Work: CRP today  If you have labs (blood work) drawn today and your tests are completely normal, you will receive your results only by: Marland Kitchen MyChart Message (if you have MyChart) OR . A paper copy in the mail If you have any lab test that is abnormal or we need to change your treatment, we will call you to review the results.   Testing/Procedures: Your physician has requested that you have an echocardiogram. Echocardiography is a painless test that uses sound waves to create images of your heart. It provides your doctor with information about the size and shape of your heart and how well your heart's chambers and valves are working. This procedure takes approximately one hour. There are no restrictions for this procedure.    Follow-Up: At [facility], you and your health needs are our priority.  As part of our continuing mission to provide you with exceptional heart care, we have created designated Provider Care Teams.  These Care Teams include your primary Cardiologist (physician) and Advanced Practice Providers (APPs -  Physician Assistants and Nurse Practitioners) who all work together to provide you with the care you need, when you need it.  We recommend signing up for the patient portal called "MyChart".  Sign up information is provided on this After Visit Summary.  MyChart is used to connect with patients for Virtual Visits (Telemedicine).  Patients are able to view lab/test results, encounter notes, upcoming appointments, etc.  Non-urgent messages can be sent to your provider as well.   To learn more about what you can do with MyChart, go to [email or web address].    Your next appointment:   As  needed  The format for your next appointment:   In Person  Provider:   You may see Dr. Verdis Prime or one of the following Advanced Practice Providers on your designated Care Team:    Norma Fredrickson, NP  Nada Boozer, NP  Georgie Chard, NP    Other Instructions

## 2019-12-31 ENCOUNTER — Other Ambulatory Visit (HOSPITAL_COMMUNITY): Payer: 59

## 2020-01-17 ENCOUNTER — Other Ambulatory Visit: Payer: Self-pay

## 2020-01-17 ENCOUNTER — Other Ambulatory Visit: Payer: 59 | Admitting: *Deleted

## 2020-01-17 ENCOUNTER — Ambulatory Visit (HOSPITAL_COMMUNITY): Payer: 59 | Attending: Cardiology

## 2020-01-17 DIAGNOSIS — R079 Chest pain, unspecified: Secondary | ICD-10-CM | POA: Diagnosis not present

## 2020-01-18 LAB — C-REACTIVE PROTEIN: CRP: 8 mg/L (ref 0–10)

## 2020-04-07 DIAGNOSIS — R42 Dizziness and giddiness: Secondary | ICD-10-CM | POA: Diagnosis not present

## 2020-04-07 DIAGNOSIS — S060X1A Concussion with loss of consciousness of 30 minutes or less, initial encounter: Secondary | ICD-10-CM | POA: Diagnosis not present

## 2020-04-07 DIAGNOSIS — S161XXA Strain of muscle, fascia and tendon at neck level, initial encounter: Secondary | ICD-10-CM | POA: Diagnosis not present

## 2020-04-07 DIAGNOSIS — R410 Disorientation, unspecified: Secondary | ICD-10-CM | POA: Diagnosis not present

## 2020-04-07 DIAGNOSIS — R55 Syncope and collapse: Secondary | ICD-10-CM | POA: Diagnosis not present

## 2020-04-07 DIAGNOSIS — S199XXA Unspecified injury of neck, initial encounter: Secondary | ICD-10-CM | POA: Diagnosis not present

## 2020-04-07 DIAGNOSIS — H53149 Visual discomfort, unspecified: Secondary | ICD-10-CM | POA: Diagnosis not present

## 2020-04-07 DIAGNOSIS — Z880 Allergy status to penicillin: Secondary | ICD-10-CM | POA: Diagnosis not present

## 2020-04-07 DIAGNOSIS — M542 Cervicalgia: Secondary | ICD-10-CM | POA: Diagnosis not present

## 2020-04-07 DIAGNOSIS — R519 Headache, unspecified: Secondary | ICD-10-CM | POA: Diagnosis not present

## 2020-04-07 DIAGNOSIS — R110 Nausea: Secondary | ICD-10-CM | POA: Diagnosis not present

## 2020-04-07 DIAGNOSIS — S0990XA Unspecified injury of head, initial encounter: Secondary | ICD-10-CM | POA: Diagnosis not present

## 2020-04-15 DIAGNOSIS — S060X9D Concussion with loss of consciousness of unspecified duration, subsequent encounter: Secondary | ICD-10-CM | POA: Diagnosis not present

## 2020-04-15 DIAGNOSIS — M542 Cervicalgia: Secondary | ICD-10-CM | POA: Diagnosis not present

## 2020-04-15 DIAGNOSIS — R561 Post traumatic seizures: Secondary | ICD-10-CM | POA: Diagnosis not present

## 2020-04-22 ENCOUNTER — Other Ambulatory Visit: Payer: Self-pay | Admitting: Sports Medicine

## 2020-04-22 DIAGNOSIS — M542 Cervicalgia: Secondary | ICD-10-CM

## 2020-04-22 DIAGNOSIS — S139XXA Sprain of joints and ligaments of unspecified parts of neck, initial encounter: Secondary | ICD-10-CM | POA: Diagnosis not present

## 2020-04-22 DIAGNOSIS — M545 Low back pain: Secondary | ICD-10-CM | POA: Diagnosis not present

## 2020-04-22 DIAGNOSIS — M5412 Radiculopathy, cervical region: Secondary | ICD-10-CM | POA: Diagnosis not present

## 2020-04-25 ENCOUNTER — Encounter: Payer: Self-pay | Admitting: Neurology

## 2020-04-25 ENCOUNTER — Other Ambulatory Visit: Payer: Self-pay

## 2020-04-25 ENCOUNTER — Ambulatory Visit: Payer: 59 | Admitting: Neurology

## 2020-04-25 VITALS — BP 112/75 | HR 77 | Ht 67.5 in | Wt 299.0 lb

## 2020-04-25 DIAGNOSIS — F0781 Postconcussional syndrome: Secondary | ICD-10-CM

## 2020-04-25 DIAGNOSIS — R561 Post traumatic seizures: Secondary | ICD-10-CM

## 2020-04-25 MED ORDER — AMITRIPTYLINE HCL 10 MG PO TABS: ORAL_TABLET | ORAL | 11 refills | Status: AC

## 2020-04-25 NOTE — Patient Instructions (Addendum)
1. Schedule MRI brain without contrast [phone]  2. Schedule routine EEG  3. Start amitriptyline 10mg : Take 1 tablet every night for 1 week, then increase to 2 tablets every night. If no improvement in symptoms after 2 weeks and no side effects, please call our office and we can adjust dose over the phone  4. Proceed with physical therapy, ask about doing Vestibular therapy for the dizziness also  5. I cannot overemphasize the importance of resting your body and brain after a concussion  6. Follow-up in 3-4 months, call for any changes

## 2020-04-25 NOTE — Progress Notes (Signed)
NEUROLOGY CONSULTATION NOTE  [name] MRN: [id number] DOB: [birth year]  Referring provider: Dr. Renford Dills Primary care provider: Dr. Renford Dills  Reason for consult:  Seizure after head trauma  Dear Dr Nehemiah Settle:  Thank you for your kind referral of [name] for consultation of the above symptoms. Although his history is well known to you, please allow me to reiterate it for the purpose of our medical record. He is alone in the office today. Records and images were personally reviewed where available.   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 He was on a work trip last 04/07/20 in [place] when he sat on a broken chair that snapped backwards. The back of his head hit the wall, then a heavy 20 lb wall fixture fell on his head. Upon impact, he lost consciousness and was witnessed to have body shaking with eyes rolling back. He does not remember anything until he woke up in the car en route to the ER. ER records from [place] were reviewed, he was reported to have loss of consciousness for a minute and endorsed headache. There is no mention of seizure activity on ER notes. He reported headache in the right lateral region, neck pain, moderate to severe. There was nausea, dizziness, light sensitivity, confusion. He had a CT head and cervical spine which were normal.   Since then, he continues to have headaches with pain in the frontal region, radiating down his neck. He has dizziness with a sensation of movement. He has a few good hours or a day, but then he would do activity or look to either side and again feel dizzy. Dramamine does not help. His neck bothers him a lot, he had an injection recently but has not noticed much change. He has Flexeril at night which causes occasional nausea. His right arm and leg go numb. Left side is  unaffected. He used to have no sleep difficulties, but since the injury he is not sleeping well. He has also noticed cognitive changes, he is dazed or hesitant to answer questions, slow to respond at times. He cannot do numbers as quickly. His wife has mentioned a few times where she has had to call him repeatedly. He has noticed temperature regulation issues, he would sweat a lot when he is dizzy. Mood is a little down because of the stress and anxiety from inability to do his prior activities. No prior history of head injuries.    PAST MEDICAL HISTORY: Past Medical History:  Diagnosis Date  . Allergy   . Asthma    inhaler as needed  . Esophagitis   . GASTRITIS 12/18/2008   Qualifier: Diagnosis of  By: Gwinda Passe RN, Cloria Spring    . GERD 12/09/2008   Qualifier: Diagnosis of  By: Alesia Richards    . GERD (gastroesophageal reflux disease)   . Hypercholesterolemia   . Morbid obesity (HCC) 01/17/2017  . Nausea with vomiting 12/09/2008   Qualifier: Diagnosis of  By: Alesia Richards    . Obesity   . OSA on CPAP 01/17/2017  . Sleep apnea    cpap    PAST SURGICAL HISTORY: Past Surgical History:  Procedure Laterality Date  . GASTRIC ROUX-EN-Y N/A 01/17/2017   Procedure: LAPAROSCOPIC ROUX-EN-Y GASTRIC BYPASS WITH UPPER ENDOSCOPY;  Surgeon: Gaynelle Adu, MD;  Location: [facility];  Service: General;  Laterality: N/A;  .  KNEE ARTHROSCOPY    . WISDOM TOOTH EXTRACTION      MEDICATIONS: Current Outpatient Medications on File Prior to Visit  Medication Sig Dispense Refill  . atorvastatin (LIPITOR) 10 MG tablet Take 10 mg by mouth daily.    . Calcium-Vitamin D-Vitamin K 500-100-40 MG-UNT-MCG CHEW Chew 1 tablet by mouth daily.    Marland Kitchen loratadine (CLARITIN) 10 MG tablet Take 10 mg by mouth daily as needed for allergies.    . Multiple Vitamins-Minerals (BARIATRIC FUSION PO) Take 1 capsule by mouth daily.    . tadalafil (CIALIS) 10 MG tablet Take 10 mg by mouth as needed.    . traMADol (ULTRAM) 50 MG tablet  Take 50 mg by mouth as needed.     No current facility-administered medications on file prior to visit.    ALLERGIES: Allergies  Allergen Reactions  . Amoxicillin Other (See Comments)    Internal burning and itching    FAMILY HISTORY: Family History  Problem Relation Age of Onset  . Hodgkin's lymphoma Mother   . Diabetes Other   . Lymphoma Other   . Colon cancer Maternal Grandfather   . Esophageal cancer Neg Hx   . Stomach cancer Neg Hx     SOCIAL HISTORY: Social History   Socioeconomic History  . Marital status: Married    Spouse name: Not on file  . Number of children: Not on file  . Years of education: Not on file  . Highest education level: Not on file  Occupational History  . Occupation: Engineering geologist  . Occupation: Pastor  Tobacco Use  . Smoking status: Light Tobacco Smoker    Types: Cigars  . Smokeless tobacco: Never Used  . Tobacco comment: Occasionally  Substance and Sexual Activity  . Alcohol use: No  . Drug use: No  . Sexual activity: Yes  Other Topics Concern  . Not on file  Social History Narrative   Daily caffeine   Social Determinants of Health   Financial Resource Strain:   . Difficulty of Paying Living Expenses: Not on file  Food Insecurity:   . Worried About Programme researcher, broadcasting/film/video in the Last Year: Not on file  . Ran Out of Food in the Last Year: Not on file  Transportation Needs:   . Lack of Transportation (Medical): Not on file  . Lack of Transportation (Non-Medical): Not on file  Physical Activity:   . Days of Exercise per Week: Not on file  . Minutes of Exercise per Session: Not on file  Stress:   . Feeling of Stress : Not on file  Social Connections:   . Frequency of Communication with Friends and Family: Not on file  . Frequency of Social Gatherings with Friends and Family: Not on file  . Attends Religious Services: Not on file  . Active Member of Clubs or Organizations: Not on file  . Attends Banker Meetings:  Not on file  . Marital Status: Not on file  Intimate Partner Violence:   . Fear of Current or Ex-Partner: Not on file  . Emotionally Abused: Not on file  . Physically Abused: Not on file  . Sexually Abused: Not on file    PHYSICAL EXAM: Vitals:   04/25/20 1041  BP: 112/75  Pulse: 77  SpO2: 99%   General: No acute distress Head:  Normocephalic/atraumatic Skin/Extremities: No rash, no edema Neurological Exam: Mental status: alert and oriented to person, place, and time, no dysarthria or aphasia, Fund of knowledge is appropriate.  Recent and remote memory are intact. 2/3 delayed recall.  Attention and concentration are normal.  5/5 WORLD backwards. Cranial nerves: CN I: not tested CN II: pupils equal, round and reactive to light, visual fields intact CN III, IV, VI:  full range of motion, no nystagmus, no ptosis CN V: facial sensation intact CN VII: upper and lower face symmetric CN VIII: hearing intact to conversation Bulk & Tone: normal, no fasciculations. Motor: 5/5 on left UE and LE, right UE. Reports pain on right hip flexion, at least 4/5, 5/5 distally.  Sensation: intact to light touch, cold, pin, vibration and joint position sense.  No extinction to double simultaneous stimulation.  Romberg test negative Deep Tendon Reflexes: +2 throughout Cerebellar: no incoordination on finger to nose testing Gait: narrow-based and steady, able to tandem walk adequately. 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 He reports a seizure at impact suggestive of immediate posttraumatic seizure. No further shaking episodes since then. He is having headaches, dizziness, sleep difficulties, cognitive changes, consistent with post-concussion syndrome. We discussed the diagnosis and prognosis of postconcussion syndrome, the importance of physical  and cognitive rest. We discussed symptomatic treatment of headaches/sleep difficulties with amitriptyline 10mg  qhs x 1 week, then increase to 20mg  qhs. Side effects discussed, dose may be increased if needed. Proceed with physical therapy for neck pain, right arm/leg numbness, this may help with dizziness as well (cervicogenic dizziness, also advised to try vestibular therapy). He is scheduled for MRI cervical spine without contrast, he will also be scheduled for an MRI brain without contrast and routine EEG. Follow-up in 4 months, he knows to call for any changes.   Thank you for allowing me to participate in the care of this patient. Please do not hesitate to call for any questions or concerns.   , M.D.  CC: Dr. 

## 2020-05-08 ENCOUNTER — Other Ambulatory Visit: Payer: 59

## 2020-05-13 ENCOUNTER — Other Ambulatory Visit: Payer: 59

## 2020-06-23 ENCOUNTER — Ambulatory Visit: Payer: 59

## 2020-06-26 ENCOUNTER — Ambulatory Visit: Payer: 59 | Attending: Internal Medicine

## 2020-06-26 DIAGNOSIS — Z23 Encounter for immunization: Secondary | ICD-10-CM

## 2020-06-26 NOTE — Progress Notes (Signed)
   Covid-19 Vaccination Clinic  Name:  [name]    MRN: [id number] DOB: Jun 30, 1981  06/26/2020  [name] was observed post Covid-19 immunization for 15 minutes without incident. He was provided with Vaccine Information Sheet and instruction to access the V-Safe system.   [name] was instructed to call 911 with any severe reactions post vaccine: Marland Kitchen Difficulty breathing  . Swelling of face and throat  . A fast heartbeat  . A bad rash all over body  . Dizziness and weakness

## 2020-08-06 DIAGNOSIS — R059 Cough, unspecified: Secondary | ICD-10-CM | POA: Diagnosis not present

## 2020-08-06 DIAGNOSIS — J0101 Acute recurrent maxillary sinusitis: Secondary | ICD-10-CM | POA: Diagnosis not present

## 2020-08-20 DIAGNOSIS — J069 Acute upper respiratory infection, unspecified: Secondary | ICD-10-CM | POA: Diagnosis not present

## 2020-08-21 DIAGNOSIS — Z8709 Personal history of other diseases of the respiratory system: Secondary | ICD-10-CM | POA: Diagnosis not present

## 2020-08-21 DIAGNOSIS — R509 Fever, unspecified: Secondary | ICD-10-CM | POA: Diagnosis not present

## 2020-08-21 DIAGNOSIS — J019 Acute sinusitis, unspecified: Secondary | ICD-10-CM | POA: Diagnosis not present

## 2020-08-21 DIAGNOSIS — J40 Bronchitis, not specified as acute or chronic: Secondary | ICD-10-CM | POA: Diagnosis not present

## 2020-08-21 DIAGNOSIS — Z03818 Encounter for observation for suspected exposure to other biological agents ruled out: Secondary | ICD-10-CM | POA: Diagnosis not present

## 2020-08-25 ENCOUNTER — Telehealth: Payer: 59 | Admitting: Neurology

## 2020-08-25 ENCOUNTER — Other Ambulatory Visit (HOSPITAL_COMMUNITY): Payer: Self-pay | Admitting: Endodontics

## 2020-08-25 ENCOUNTER — Other Ambulatory Visit: Payer: Self-pay

## 2020-08-29 MED FILL — DOXYCYCLINE HYCLATE 100 MG: 100 | 30 days supply | Qty: 30 | Fill #0

## 2020-09-03 DIAGNOSIS — J069 Acute upper respiratory infection, unspecified: Secondary | ICD-10-CM | POA: Diagnosis not present

## 2020-09-04 ENCOUNTER — Ambulatory Visit
Admission: RE | Admit: 2020-09-04 | Discharge: 2020-09-04 | Disposition: A | Discharge status: Home / Self Care | Payer: 59 | Source: Ambulatory Visit | Attending: Internal Medicine | Admitting: Internal Medicine

## 2020-09-04 ENCOUNTER — Other Ambulatory Visit: Payer: Self-pay | Admitting: Internal Medicine

## 2020-09-04 DIAGNOSIS — R059 Cough, unspecified: Secondary | ICD-10-CM

## 2020-09-04 DIAGNOSIS — R5383 Other fatigue: Secondary | ICD-10-CM | POA: Diagnosis not present

## 2020-09-04 DIAGNOSIS — J4521 Mild intermittent asthma with (acute) exacerbation: Secondary | ICD-10-CM | POA: Diagnosis not present

## 2020-09-04 DIAGNOSIS — J45901 Unspecified asthma with (acute) exacerbation: Secondary | ICD-10-CM | POA: Diagnosis not present

## 2020-09-04 DIAGNOSIS — J45909 Unspecified asthma, uncomplicated: Secondary | ICD-10-CM | POA: Diagnosis not present

## 2020-12-29 DIAGNOSIS — J45909 Unspecified asthma, uncomplicated: Secondary | ICD-10-CM | POA: Diagnosis not present

## 2020-12-29 DIAGNOSIS — R5383 Other fatigue: Secondary | ICD-10-CM | POA: Diagnosis not present

## 2020-12-29 DIAGNOSIS — J4521 Mild intermittent asthma with (acute) exacerbation: Secondary | ICD-10-CM | POA: Diagnosis not present

## 2020-12-29 DIAGNOSIS — R059 Cough, unspecified: Secondary | ICD-10-CM | POA: Diagnosis not present

## 2021-03-30 DIAGNOSIS — Z03818 Encounter for observation for suspected exposure to other biological agents ruled out: Secondary | ICD-10-CM | POA: Diagnosis not present

## 2021-03-30 DIAGNOSIS — J0101 Acute recurrent maxillary sinusitis: Secondary | ICD-10-CM | POA: Diagnosis not present

## 2021-03-30 DIAGNOSIS — H1031 Unspecified acute conjunctivitis, right eye: Secondary | ICD-10-CM | POA: Diagnosis not present

## 2021-03-30 DIAGNOSIS — R519 Headache, unspecified: Secondary | ICD-10-CM | POA: Diagnosis not present

## 2021-04-16 DIAGNOSIS — R350 Frequency of micturition: Secondary | ICD-10-CM | POA: Diagnosis not present

## 2021-04-16 DIAGNOSIS — E7800 Pure hypercholesterolemia, unspecified: Secondary | ICD-10-CM | POA: Diagnosis not present

## 2021-04-16 DIAGNOSIS — G4733 Obstructive sleep apnea (adult) (pediatric): Secondary | ICD-10-CM | POA: Diagnosis not present

## 2021-04-16 DIAGNOSIS — Z0000 Encounter for general adult medical examination without abnormal findings: Secondary | ICD-10-CM | POA: Diagnosis not present

## 2021-04-17 DIAGNOSIS — Z9884 Bariatric surgery status: Secondary | ICD-10-CM | POA: Diagnosis not present

## 2021-04-17 DIAGNOSIS — Z8669 Personal history of other diseases of the nervous system and sense organs: Secondary | ICD-10-CM | POA: Diagnosis not present

## 2021-04-17 DIAGNOSIS — G479 Sleep disorder, unspecified: Secondary | ICD-10-CM | POA: Diagnosis not present

## 2021-05-08 ENCOUNTER — Other Ambulatory Visit (HOSPITAL_BASED_OUTPATIENT_CLINIC_OR_DEPARTMENT_OTHER): Payer: Self-pay

## 2021-05-08 DIAGNOSIS — G4733 Obstructive sleep apnea (adult) (pediatric): Secondary | ICD-10-CM

## 2021-06-29 DIAGNOSIS — R052 Subacute cough: Secondary | ICD-10-CM | POA: Diagnosis not present

## 2021-06-29 DIAGNOSIS — J45901 Unspecified asthma with (acute) exacerbation: Secondary | ICD-10-CM | POA: Diagnosis not present

## 2021-06-29 DIAGNOSIS — J0101 Acute recurrent maxillary sinusitis: Secondary | ICD-10-CM | POA: Diagnosis not present

## 2021-07-01 ENCOUNTER — Other Ambulatory Visit (HOSPITAL_COMMUNITY): Payer: Self-pay

## 2021-07-01 MED ORDER — ALBUTEROL SULFATE HFA 108 (90 BASE) MCG/ACT IN AERS
1.0000 | INHALATION_SPRAY | RESPIRATORY_TRACT | 0 refills | Status: AC | PRN
  Filled 2021-07-01: qty 18, 33d supply, fill #0

## 2021-07-13 ENCOUNTER — Other Ambulatory Visit (HOSPITAL_COMMUNITY): Payer: Self-pay

## 2021-08-03 DIAGNOSIS — R509 Fever, unspecified: Secondary | ICD-10-CM | POA: Diagnosis not present

## 2021-08-03 DIAGNOSIS — U071 COVID-19: Secondary | ICD-10-CM | POA: Diagnosis not present

## 2021-08-03 DIAGNOSIS — R051 Acute cough: Secondary | ICD-10-CM | POA: Diagnosis not present

## 2021-08-03 DIAGNOSIS — R0981 Nasal congestion: Secondary | ICD-10-CM | POA: Diagnosis not present

## 2021-08-03 DIAGNOSIS — R52 Pain, unspecified: Secondary | ICD-10-CM | POA: Diagnosis not present

## 2021-09-08 IMAGING — DX DG CHEST 2V
2 series · 2 of 2 positions shown · non-contrast
Comparison: 11/23/2016

CLINICAL DATA: Central chest pain and shortness of breath, nausea,
pain with inspiration

EXAM:
CHEST - 2 VIEW

[chest pa]
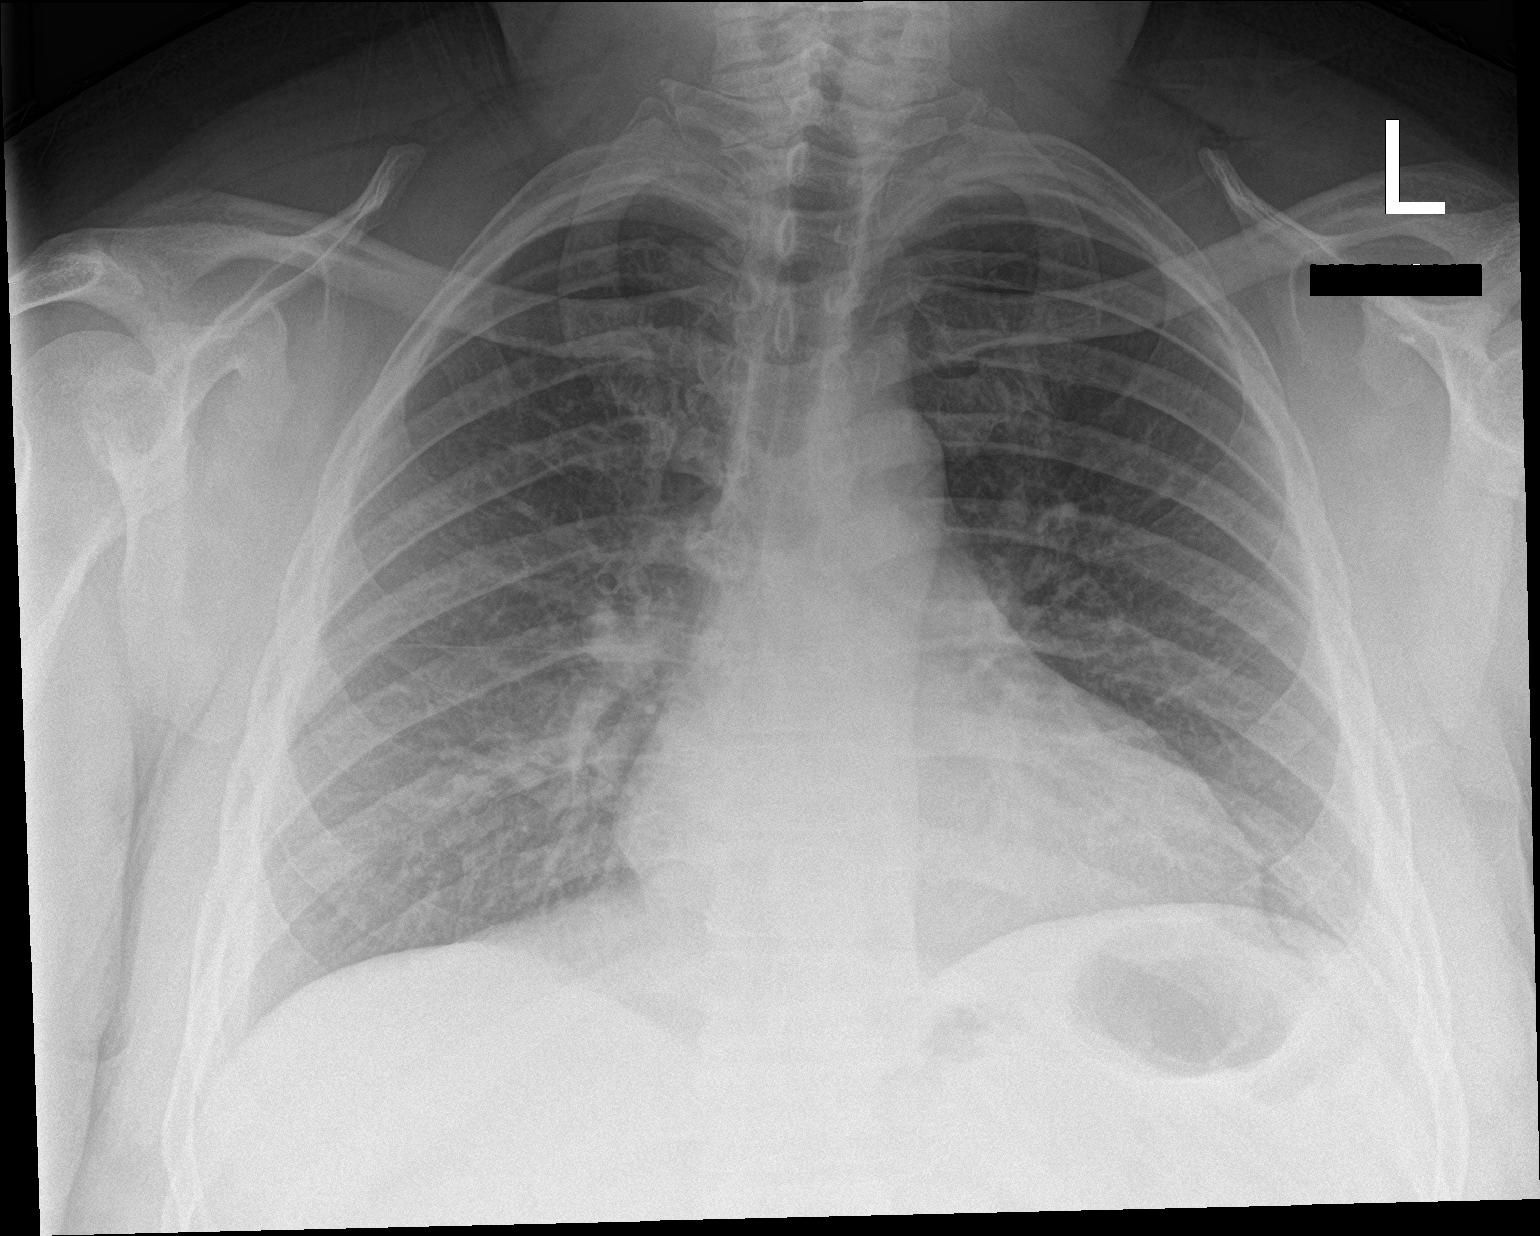

[chest lat]
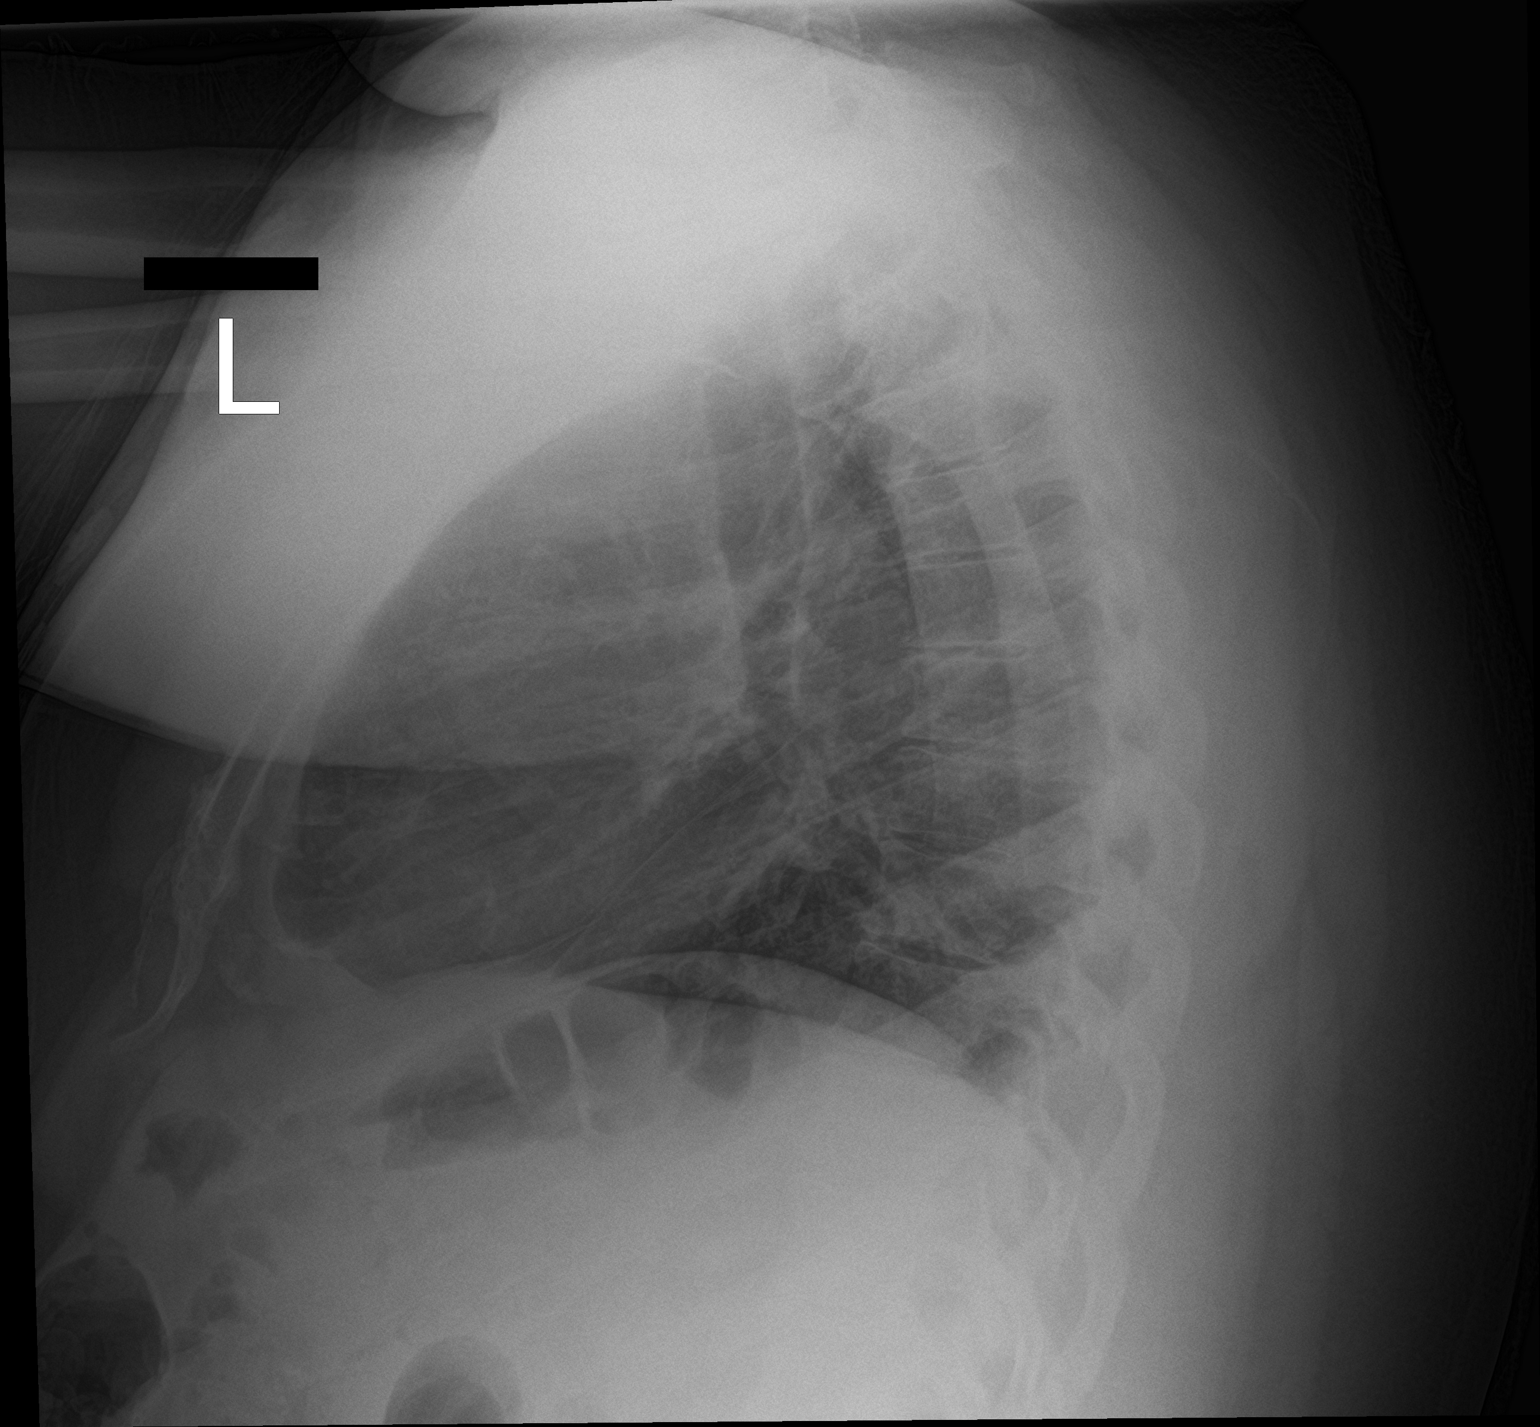

[2 of 2 positions shown; findings below may reference images not displayed]

FINDINGS: The heart size and mediastinal contours are within normal limits.
Both lungs are clear. The visualized skeletal structures are
unremarkable.
IMPRESSION: No active cardiopulmonary disease.

## 2021-09-17 DIAGNOSIS — Z9884 Bariatric surgery status: Secondary | ICD-10-CM | POA: Diagnosis not present

## 2021-09-17 DIAGNOSIS — Z8669 Personal history of other diseases of the nervous system and sense organs: Secondary | ICD-10-CM | POA: Diagnosis not present

## 2021-09-17 DIAGNOSIS — E7800 Pure hypercholesterolemia, unspecified: Secondary | ICD-10-CM | POA: Diagnosis not present

## 2021-10-13 DIAGNOSIS — R059 Cough, unspecified: Secondary | ICD-10-CM | POA: Diagnosis not present

## 2021-10-13 DIAGNOSIS — Z03818 Encounter for observation for suspected exposure to other biological agents ruled out: Secondary | ICD-10-CM | POA: Diagnosis not present

## 2022-01-23 ENCOUNTER — Other Ambulatory Visit (HOSPITAL_COMMUNITY): Payer: Self-pay

## 2022-01-23 ENCOUNTER — Other Ambulatory Visit: Payer: Self-pay

## 2022-01-23 ENCOUNTER — Emergency Department (HOSPITAL_BASED_OUTPATIENT_CLINIC_OR_DEPARTMENT_OTHER)
Admission: EM | Admit: 2022-01-23 | Discharge: 2022-01-24 | Disposition: A | Discharge status: Home / Self Care | Payer: 59 | Attending: Emergency Medicine | Admitting: Emergency Medicine

## 2022-01-23 ENCOUNTER — Ambulatory Visit (HOSPITAL_COMMUNITY)
Admission: EM | Admit: 2022-01-23 | Discharge: 2022-01-23 | Disposition: A | Discharge status: Home / Self Care | Payer: 59

## 2022-01-23 ENCOUNTER — Encounter (HOSPITAL_BASED_OUTPATIENT_CLINIC_OR_DEPARTMENT_OTHER): Payer: Self-pay | Admitting: *Deleted

## 2022-01-23 ENCOUNTER — Emergency Department (HOSPITAL_BASED_OUTPATIENT_CLINIC_OR_DEPARTMENT_OTHER): Payer: 59

## 2022-01-23 ENCOUNTER — Encounter (HOSPITAL_COMMUNITY): Payer: Self-pay

## 2022-01-23 DIAGNOSIS — R110 Nausea: Secondary | ICD-10-CM | POA: Insufficient documentation

## 2022-01-23 DIAGNOSIS — R109 Unspecified abdominal pain: Secondary | ICD-10-CM

## 2022-01-23 DIAGNOSIS — R6883 Chills (without fever): Secondary | ICD-10-CM | POA: Insufficient documentation

## 2022-01-23 DIAGNOSIS — R1011 Right upper quadrant pain: Secondary | ICD-10-CM | POA: Diagnosis not present

## 2022-01-23 DIAGNOSIS — R0602 Shortness of breath: Secondary | ICD-10-CM | POA: Insufficient documentation

## 2022-01-23 DIAGNOSIS — K824 Cholesterolosis of gallbladder: Secondary | ICD-10-CM | POA: Diagnosis not present

## 2022-01-23 DIAGNOSIS — R079 Chest pain, unspecified: Secondary | ICD-10-CM | POA: Diagnosis not present

## 2022-01-23 DIAGNOSIS — R9431 Abnormal electrocardiogram [ECG] [EKG]: Secondary | ICD-10-CM | POA: Diagnosis not present

## 2022-01-23 DIAGNOSIS — K760 Fatty (change of) liver, not elsewhere classified: Secondary | ICD-10-CM | POA: Diagnosis not present

## 2022-01-23 LAB — COMPREHENSIVE METABOLIC PANEL
ALT: 30 U/L (ref 0–44)
AST: 18 U/L (ref 15–41)
Albumin: 4.1 g/dL (ref 3.5–5.0)
Alkaline Phosphatase: 71 U/L (ref 38–126)
Anion gap: 10 (ref 5–15)
BUN: 14 mg/dL (ref 6–20)
CO2: 25 mmol/L (ref 22–32)
Calcium: 9.2 mg/dL (ref 8.9–10.3)
Chloride: 106 mmol/L (ref 98–111)
Creatinine, Ser: 0.8 mg/dL (ref 0.61–1.24)
GFR, Estimated: >60 mL/min (ref >60)
Glucose, Bld: 105 mg/dL — ABNORMAL HIGH (ref 70–99)
Potassium: 3.7 mmol/L (ref 3.5–5.1)
Sodium: 141 mmol/L (ref 135–145)
Total Bilirubin: 0.3 mg/dL (ref 0.3–1.2)
Total Protein: 6.8 g/dL (ref 6.5–8.1)

## 2022-01-23 LAB — BRAIN NATRIURETIC PEPTIDE: B Natriuretic Peptide: 11.9 pg/mL (ref 0.0–100.0)

## 2022-01-23 LAB — D-DIMER, QUANTITATIVE: D-Dimer, Quant: <0.27 ug/mL-FEU (ref 0.00–0.50)

## 2022-01-23 LAB — URINALYSIS, ROUTINE W REFLEX MICROSCOPIC
Bilirubin Urine: NEGATIVE
Glucose, UA: NEGATIVE mg/dL
Ketones, ur: NEGATIVE mg/dL
Leukocytes,Ua: NEGATIVE
Nitrite: NEGATIVE
Protein, ur: NEGATIVE mg/dL
Specific Gravity, Urine: 1.03 (ref 1.005–1.030)
pH: 5.5 (ref 5.0–8.0)

## 2022-01-23 LAB — TROPONIN I (HIGH SENSITIVITY): Troponin I (High Sensitivity): 3 ng/L (ref <18)

## 2022-01-23 LAB — CBC WITH DIFFERENTIAL/PLATELET
Abs Immature Granulocytes: 0.01 10*3/uL (ref 0.00–0.07)
Basophils Absolute: 0.1 10*3/uL (ref 0.0–0.1)
Basophils Relative: 1 %
Eosinophils Absolute: 0.2 10*3/uL (ref 0.0–0.5)
Eosinophils Relative: 4 %
HCT: 37.9 % — ABNORMAL LOW (ref 39.0–52.0)
Hemoglobin: 12.3 g/dL — ABNORMAL LOW (ref 13.0–17.0)
Immature Granulocytes: 0 %
Lymphocytes Relative: 45 %
Lymphs Abs: 2.9 10*3/uL (ref 0.7–4.0)
MCH: 27.1 pg (ref 26.0–34.0)
MCHC: 32.5 g/dL (ref 30.0–36.0)
MCV: 83.5 fL (ref 80.0–100.0)
Monocytes Absolute: 0.5 10*3/uL (ref 0.1–1.0)
Monocytes Relative: 8 %
Neutro Abs: 2.7 10*3/uL (ref 1.7–7.7)
Neutrophils Relative %: 42 %
Platelets: 229 10*3/uL (ref 150–400)
RBC: 4.54 MIL/uL (ref 4.22–5.81)
RDW: 12.6 % (ref 11.5–15.5)
WBC: 6.5 10*3/uL (ref 4.0–10.5)
nRBC: 0 % (ref 0.0–0.2)

## 2022-01-23 LAB — LIPASE, BLOOD: Lipase: 32 U/L (ref 11–51)

## 2022-01-23 MED ORDER — SODIUM CHLORIDE 0.9 % IV BOLUS
1000.0000 mL | Freq: Once | INTRAVENOUS | Status: AC
  Administered 2022-01-23: 1000 mL via INTRAVENOUS

## 2022-01-23 MED ORDER — FENTANYL CITRATE PF 50 MCG/ML IJ SOSY
50.0000 ug | PREFILLED_SYRINGE | Freq: Once | INTRAMUSCULAR | Status: AC
  Administered 2022-01-23: 50 ug via INTRAVENOUS
  Filled 2022-01-23: qty 1

## 2022-01-23 MED ORDER — ONDANSETRON HCL 4 MG/2ML IJ SOLN
4.0000 mg | Freq: Once | INTRAMUSCULAR | Status: AC
  Administered 2022-01-23: 4 mg via INTRAVENOUS
  Filled 2022-01-23: qty 2

## 2022-01-23 NOTE — ED Triage Notes (Addendum)
Patient reports right sided upper abdominal pain---radiates to lower abdomen--increases with movement and deep breaths X 2 days. Reports nausea and decreased appetite.   No injury or straining.   Has already completed US.

## 2022-01-23 NOTE — ED Notes (Signed)
Report given to cameron RN at [facility] long ED

## 2022-01-23 NOTE — ED Provider Notes (Signed)
[facility]    CSN: [id number] Arrival date & time: 01/23/22  1754      History   Chief Complaint Chief Complaint  Patient presents with   Flank Pain    HPI [name] is a 41 y.o. male.   Patient presents with right-sided flank pain beginning 2 days ago.  His pain has been constant worsened with deep breathing and movement .  Rating a 10 out of 10 and feels as if it is worsening.  Associated nausea without vomiting and shortness of breath with exertion.  Denies bloating, increased gas production, heartburn or indigestion, chest pain or tightness, cough or wheezing, fever or chills.  Last bowel movement this morning, endorses that his stools are firmer than normal requiring mild straining.  Denies precipitating event, injury or trauma.  Attempted use of Tylenol and ibuprofen which have been ineffective.    Past Medical History:  Diagnosis Date   Allergy    Asthma    inhaler as needed   Esophagitis    GASTRITIS 12/18/2008   Qualifier: Diagnosis of  By: Gwinda PasseGilliam RN, Carissa     GERD 12/09/2008   Qualifier: Diagnosis of  By: Alesia RichardsSmith NCMA, Barbara     GERD (gastroesophageal reflux disease)    Hypercholesterolemia    Morbid obesity (HCC) 01/17/2017   Nausea with vomiting 12/09/2008   Qualifier: Diagnosis of  By: Alesia RichardsSmith NCMA, Barbara     Obesity    OSA on CPAP 01/17/2017   Sleep apnea    cpap    Patient Active Problem List   Diagnosis Date Noted   OSA on CPAP 01/17/2017   Morbid obesity (HCC) 01/17/2017   Hypercholesterolemia    GASTRITIS 12/18/2008   GERD 12/09/2008   NAUSEA WITH VOMITING 12/09/2008    Past Surgical History:  Procedure Laterality Date   GASTRIC ROUX-EN-Y N/A 01/17/2017   Procedure: LAPAROSCOPIC ROUX-EN-Y GASTRIC BYPASS WITH UPPER ENDOSCOPY;  Surgeon: Gaynelle AduWilson, Eric, MD;  Location: [facility];  Service: General;  Laterality: N/A;   KNEE ARTHROSCOPY     WISDOM TOOTH EXTRACTION         Home Medications    Prior to Admission medications    Medication Sig Start Date End Date Taking? Authorizing Provider  albuterol (VENTOLIN HFA) 108 (90 Base) MCG/ACT inhaler Inhale 1 puff into the lungs every 4 (four) hours as needed. 06/29/21     amitriptyline (ELAVIL) 10 MG tablet Take 1 tablet every night for 1 week, then increase to 2 tablets every night 04/25/20   Van ClinesAquino, Karen M, MD  atorvastatin (LIPITOR) 10 MG tablet Take 10 mg by mouth daily. 10/08/19   [provider]  Calcium-Vitamin D-Vitamin K 500-100-40 MG-UNT-MCG CHEW Chew 1 tablet by mouth daily. Patient not taking: Reported on 04/25/2020    [provider]  cyclobenzaprine (FLEXERIL) 10 MG tablet Take 10 mg by mouth at bedtime. 04/15/20   [provider]  loratadine (CLARITIN) 10 MG tablet Take 10 mg by mouth daily as needed for allergies.    [provider]  Multiple Vitamins-Minerals (BARIATRIC FUSION PO) Take 1 capsule by mouth daily. Patient not taking: Reported on 04/25/2020    [provider]  naproxen (NAPROSYN) 500 MG tablet Take by mouth. 04/07/20   [provider]  tadalafil (CIALIS) 10 MG tablet Take 10 mg by mouth as needed. Patient not taking: Reported on 04/25/2020 07/24/19   [provider]  traMADol (ULTRAM) 50 MG tablet Take 50 mg by mouth as needed. Patient  not taking: Reported on 04/25/2020 09/14/19   [provider]    Family History Family History  Problem Relation Age of Onset   Hodgkin's lymphoma Mother    Diabetes Other    Lymphoma Other    Colon cancer Maternal Grandfather    Esophageal cancer Neg Hx    Stomach cancer Neg Hx     Social History Social History   Tobacco Use   Smoking status: Light Smoker    Types: Cigars   Smokeless tobacco: Never   Tobacco comments:    Occasionally  Vaping Use   Vaping Use: Never used  Substance Use Topics   Alcohol use: No   Drug use: No     Allergies   Amoxicillin   Review of Systems Review of Systems  Constitutional: Negative.    Respiratory: Negative.    Cardiovascular: Negative.   Gastrointestinal:  Positive for nausea. Negative for abdominal distention, abdominal pain, anal bleeding, blood in stool, constipation, diarrhea, rectal pain and vomiting.  Genitourinary:  Positive for flank pain. Negative for decreased urine volume, difficulty urinating, dysuria, enuresis, frequency, genital sores, hematuria, penile discharge, penile pain, penile swelling, scrotal swelling, testicular pain and urgency.  Skin: Negative.   Neurological: Negative.      Physical Exam Triage Vital Signs ED Triage Vitals  Enc Vitals Group     BP 01/23/22 1808 116/75     Pulse Rate 01/23/22 1807 82     Resp 01/23/22 1807 16     Temp 01/23/22 1807 98.2 F (36.8 C)     Temp Source 01/23/22 1807 Oral     SpO2 01/23/22 1807 97 %     Weight --      Height --      Head Circumference --      Peak Flow --      Pain Score 01/23/22 1807 10     Pain Loc --      Pain Edu? --      Excl. in GC? --    No data found.  Updated Vital Signs BP 116/75   Pulse 82   Temp 98.2 F (36.8 C) (Oral)   Resp 16   SpO2 97%   Visual Acuity Right Eye Distance:   Left Eye Distance:   Bilateral Distance:    Right Eye Near:   Left Eye Near:    Bilateral Near:     Physical Exam   UC Treatments / Results  Labs (all labs ordered are listed, but only abnormal results are displayed) Labs Reviewed - No data to display  EKG   Radiology No results found.  Procedures Procedures (including critical care time)  Medications Ordered in UC Medications - No data to display  Initial Impression / Assessment and Plan / UC Course  I have reviewed the triage vital signs and the nursing notes.  Pertinent labs & imaging results that were available during my care of the patient were reviewed by me and considered in my medical decision making (see chart for details).  Right upper quadrant pain  Patient sent to the nearest emergency department for  further evaluation of right upper quadrant pain and CT imaging, on exam tenderness tenderness is noted and patient is grimacing, unable to maintain seated position on exam table due to discomfort, low suspicion for lung involvement as his oxygen level is 97% and lungs are clear to auscultation, vital signs are stable at this time and patient will escort self Final Clinical Impressions(s) / UC Diagnoses  Final diagnoses:  None   Discharge Instructions   None    ED Prescriptions   None    PDMP not reviewed this encounter.   Valinda Hoar, NP 01/23/22 [phone]

## 2022-01-23 NOTE — ED Notes (Signed)
Patient is being discharged from the Urgent Care and sent to the Emergency Department via POV . Per Hansel Starling NP, patient is in need of higher level of care due to right upper flank pain. Patient is aware and verbalizes understanding of plan of care.  Vitals:   01/23/22 1807 01/23/22 1808  BP:  116/75  Pulse: 82   Resp: 16   Temp: 98.2 F (36.8 C)   SpO2: 97%

## 2022-01-23 NOTE — ED Triage Notes (Signed)
Pt states right side pain worse when he takes a deep breath, denies injury. Took Motrin with no relief.

## 2022-01-23 NOTE — Discharge Instructions (Signed)
Please go to the nearest emergency department for further evaluation and CT imaging, while I have a low suspicion that your symptoms today are being caused by your lungs you are having a significant amount of pain over your right upper quadrant where your liver, colon and gallbladder are

## 2022-01-23 NOTE — ED Provider Notes (Signed)
[facility] EMERGENCY DEPT Provider Note   CSN: [id number] Arrival date & time: 01/23/22  1936     History  Chief Complaint  Patient presents with   [name]    ASSAD MUHAMMED is a 41 y.o. male.  He has no significant past medical history.  He is complaining of a day and a half of right upper quadrant [name].  Worse with movement although also Chan at rest.  Severe in nature.  He is also noticed some shortness of breath with any type of exertion.  Right upper quadrant Chan worse with taking a deep breath.  Nausea no vomiting.  Said his bowels have been a little more firm than normal.  No urinary symptoms.  Has felt hot and cold but no documented fevers.  He went to urgent care tonight where they recommended he come here for further evaluation.  He has a prior history of a Roux-en-Y 4 years ago without any complications.  The history is provided by the patient.  [name] Chan location:  RUQ Chan quality: stabbing   Chan radiates to:  Does not radiate Chan severity:  Severe Onset quality:  Gradual Duration:  2 days Timing:  Constant Progression:  Unchanged Chronicity:  New Context: not recent travel and not trauma   Relieved by:  None tried Worsened by:  Movement, palpation and deep breathing Ineffective treatments:  None tried Associated symptoms: chills, nausea and shortness of breath   Associated symptoms: no chest Chan, no cough, no diarrhea, no dysuria, no hematuria, no sore throat and no vomiting        Home Medications Prior to Admission medications   Medication Sig Start Date End Date Taking? Authorizing Provider  albuterol (VENTOLIN HFA) 108 (90 Base) MCG/ACT inhaler Inhale 1 puff into the lungs every 4 (four) hours as needed. 06/29/21     amitriptyline (ELAVIL) 10 MG tablet Take 1 tablet every night for 1 week, then increase to 2 tablets every night 04/25/20   Cameron Sprang, MD  atorvastatin (LIPITOR) 10 MG tablet Take 10 mg by  mouth daily. 10/08/19   [provider]  Calcium-Vitamin D-Vitamin K 500-100-40 MG-UNT-MCG CHEW Chew 1 tablet by mouth daily. Patient not taking: Reported on 04/25/2020    [provider]  cyclobenzaprine (FLEXERIL) 10 MG tablet Take 10 mg by mouth at bedtime. 04/15/20   [provider]  loratadine (CLARITIN) 10 MG tablet Take 10 mg by mouth daily as needed for allergies.    [provider]  Multiple Vitamins-Minerals (BARIATRIC FUSION PO) Take 1 capsule by mouth daily. Patient not taking: Reported on 04/25/2020    [provider]  naproxen (NAPROSYN) 500 MG tablet Take by mouth. 04/07/20   [provider]  tadalafil (CIALIS) 10 MG tablet Take 10 mg by mouth as needed. Patient not taking: Reported on 04/25/2020 07/24/19   [provider]  traMADol (ULTRAM) 50 MG tablet Take 50 mg by mouth as needed. Patient not taking: Reported on 04/25/2020 09/14/19   [provider]      Allergies    Amoxicillin    Review of Systems   Review of Systems  Constitutional:  Positive for chills.  HENT:  Negative for sore throat.   Eyes:  Negative for visual disturbance.  Respiratory:  Positive for shortness of breath. Negative for cough.   Cardiovascular:  Negative for chest Chan.  Gastrointestinal:  Positive for [name] and nausea. Negative for diarrhea and vomiting.  Genitourinary:  Negative for dysuria and hematuria.  Musculoskeletal:  Negative for back Chan.  Skin:  Negative for rash.  Neurological:  Negative for headaches.    Physical Exam Updated Vital Signs BP 111/63 (BP Location: Right Arm)   Pulse 79   Temp 98.2 F (36.8 C) (Oral)   Resp 16   SpO2 99%  Physical Exam Vitals and nursing note reviewed.  Constitutional:      General: He is not in acute distress.    Appearance: Normal appearance. He is well-developed.  HENT:     Head: Normocephalic and atraumatic.  Eyes:     Conjunctiva/sclera: Conjunctivae normal.   Cardiovascular:     Rate and Rhythm: Normal rate and regular rhythm.     Heart sounds: No murmur heard. Pulmonary:     Effort: Pulmonary effort is normal. No respiratory distress.     Breath sounds: Normal breath sounds.  Jesse:     Palpations: Abdomen is soft.     Tenderness: There is Jesse tenderness in the right upper quadrant and right lower quadrant. There is no guarding or rebound.  Musculoskeletal:        General: No tenderness.     Cervical back: Neck supple.     Right lower leg: No edema.     Left lower leg: No edema.  Skin:    General: Skin is warm and dry.     Capillary Refill: Capillary refill takes less than 2 seconds.  Neurological:     General: No focal deficit present.     Mental Status: He is alert.     Gait: Gait normal.  Psychiatric:        Mood and Affect: Mood normal.     ED Results / Procedures / Treatments   Labs (all labs ordered are listed, but only abnormal results are displayed) Labs Reviewed  COMPREHENSIVE METABOLIC PANEL - Abnormal; Notable for the following components:      Result Value   Glucose, Bld 105 (*)    All other components within normal limits  URINALYSIS, ROUTINE W REFLEX MICROSCOPIC - Abnormal; Notable for the following components:   Hgb urine dipstick TRACE (*)    All other components within normal limits  CBC WITH DIFFERENTIAL/PLATELET - Abnormal; Notable for the following components:   Hemoglobin 12.3 (*)    HCT 37.9 (*)    All other components within normal limits  LIPASE, BLOOD  D-DIMER, QUANTITATIVE  BRAIN NATRIURETIC PEPTIDE  CBC WITH DIFFERENTIAL/PLATELET  TROPONIN I (HIGH SENSITIVITY)    EKG None  Radiology DG Chest Port 1 View  Result Date: 01/23/2022 CLINICAL DATA:  Right-sided upper [name]. EXAM: PORTABLE CHEST 1 VIEW COMPARISON:  September 04, 2020 FINDINGS: The heart size and mediastinal contours are within normal limits. Low lung volumes are seen. Both lungs are clear. The visualized  skeletal structures are unremarkable. IMPRESSION: No active disease. Electronically Signed   By: Virgina Norfolk M.D.   On: 01/23/2022 20:58   US Abdomen Limited RUQ (LIVER/GB)  Result Date: 01/23/2022 CLINICAL DATA:  Right upper quadrant Chan and nausea. EXAM: ULTRASOUND ABDOMEN LIMITED RIGHT UPPER QUADRANT COMPARISON:  09/14/2019, 11/06/2016. FINDINGS: Gallbladder: No gallstones or wall thickening visualized. A nonshadowing echogenic focus is noted in the gallbladder measuring 3 mm, possible polyp. No sonographic Murphy sign noted by sonographer. Common bile duct: Diameter: 3 mm. Liver: No focal lesion identified. Increased parenchymal echogenicity. Portal vein is patent on color Doppler imaging with normal direction of blood flow towards the liver. Other: No  free fluid. IMPRESSION: 1. No cholelithiasis or acute cholecystitis. 2. Stable 3 mm gallbladder polyp. 3. Hepatic steatosis. Electronically Signed   By: Brett Fairy M.D.   On: 01/23/2022 20:15    Procedures Procedures    Medications Ordered in ED Medications  fentaNYL (SUBLIMAZE) injection 50 mcg (has no administration in time range)  sodium chloride 0.9 % bolus 1,000 mL (has no administration in time range)  ondansetron (ZOFRAN) injection 4 mg (has no administration in time range)    ED Course/ Medical Decision Making/ A&P Clinical Course as of 01/24/22 0900  Sat Jan 23, 2022  2058 Chest x-ray interpreted by me as no clear infiltrates.  Awaiting radiology reading. [MB]  2104 EKG not crossing in epic, normal sinus rhythm rate of 72 nonspecific T wave abnormalities no significant change since 2/21 [MB]  2257 His work-up so far has been negative.  His Chan is recurred after some improvement after Chan medication.  We discussed transfer for further imaging and he is agreeable to this. [MB]  2311 We will transfer patient to [facility] long.  Discussed with ED physician Dr. Francia Greaves who accepts the patient in transfer. [MB]    Clinical  Course User Index [MB] Hayden Rasmussen, MD                           Medical Decision Making Amount and/or Complexity of Data Reviewed Labs: ordered. Radiology: ordered.  Risk Prescription drug management.  This patient complains of right-sided [name] somewhat into his back shortness of breath; this involves an extensive number of treatment Options and is a complaint that carries with it a high risk of complications and morbidity. The differential includes pneumonia, PE, pneumothorax, COVID biases, cholecystitis, musculoskeletal, colitis, diverticulitis, obstruction  I ordered, reviewed and interpreted labs, which included CBC with normal white count, hemoglobin low stable from priors, chemistries normal LFTs normal lipase normal, D-dimer negative urinalysis unremarkable I ordered medication IV fluids Chan medication nausea medication with improvement in symptoms and reviewed PMP when indicated. I ordered imaging studies which included right upper quadrant ultrasound and chest x-ray and I independently    visualized and interpreted imaging which showed no acute findings Previous records obtained and reviewed in epic including urgent care visit today Cardiac monitoring reviewed, normal sinus rhythm Social determinants considered, no significant barriers Critical Interventions: None  After the interventions stated above, I reevaluated the patient and found patient be hemodynamically stable. Admission and further testing considered, had a long discussion with patient and he elects to undergo CT imaging.  Unfortunately CAT scan is down here so we will transfer to [facility] long.  Discussed with Dr. Francia Greaves ED physician who accepts in transfer.  Patient waiting for transport team. updated oncoming physician Dr. Leonette Monarch at [place] of patient's status          Final Clinical Impression(s) / ED Diagnoses Final diagnoses:  Right sided [name]    Rx / DC Orders ED  Discharge Orders     None         Hayden Rasmussen, MD 01/24/22 [phone]

## 2022-01-23 NOTE — ED Notes (Signed)
[place] in progress, will call back for triage when available.

## 2022-01-24 DIAGNOSIS — R110 Nausea: Secondary | ICD-10-CM | POA: Diagnosis not present

## 2022-01-24 DIAGNOSIS — R0602 Shortness of breath: Secondary | ICD-10-CM | POA: Diagnosis not present

## 2022-01-24 DIAGNOSIS — R6883 Chills (without fever): Secondary | ICD-10-CM | POA: Diagnosis not present

## 2022-01-24 DIAGNOSIS — R1011 Right upper quadrant pain: Secondary | ICD-10-CM | POA: Diagnosis not present

## 2022-01-24 MED ORDER — LIDOCAINE VISCOUS HCL 2 % MT SOLN
15.0000 mL | Freq: Once | OROMUCOSAL | Status: AC
  Administered 2022-01-24: 15 mL via ORAL
  Filled 2022-01-24: qty 15

## 2022-01-24 MED ORDER — KETOROLAC TROMETHAMINE 15 MG/ML IJ SOLN
15.0000 mg | Freq: Once | INTRAMUSCULAR | Status: AC
  Administered 2022-01-24: 15 mg via INTRAVENOUS
  Filled 2022-01-24: qty 1

## 2022-01-24 MED ORDER — ALUM & MAG HYDROXIDE-SIMETH 200-200-20 MG/5ML PO SUSP
30.0000 mL | Freq: Once | ORAL | Status: AC
  Administered 2022-01-24: 30 mL via ORAL
  Filled 2022-01-24: qty 30

## 2022-01-24 MED ORDER — LIDOCAINE 5 % EX PTCH
1.0000 | MEDICATED_PATCH | CUTANEOUS | 0 refills | Status: AC
Start: 2022-01-24 — End: 2022-02-23

## 2022-01-24 MED ORDER — VALACYCLOVIR HCL 1 G PO TABS: 1000.0000 mg | ORAL_TABLET | Freq: Three times a day (TID) | ORAL | 0 refills | Status: AC

## 2022-01-24 MED ORDER — HYOSCYAMINE SULFATE 0.125 MG SL SUBL
0.2500 mg | SUBLINGUAL_TABLET | Freq: Once | SUBLINGUAL | Status: AC
  Administered 2022-01-24: 0.25 mg via SUBLINGUAL
  Filled 2022-01-24: qty 2

## 2022-01-24 MED ORDER — OXYCODONE HCL 5 MG PO TABS: 2.5000 mg | ORAL_TABLET | Freq: Four times a day (QID) | ORAL | 0 refills | Status: AC | PRN

## 2022-01-24 MED ORDER — LIDOCAINE 5 % EX PTCH
1.0000 | MEDICATED_PATCH | CUTANEOUS | Status: DC
  Administered 2022-01-24: 1 via TRANSDERMAL
  Filled 2022-01-24: qty 1

## 2022-01-24 MED ORDER — PREDNISONE 10 MG PO TABS: 20.0000 mg | ORAL_TABLET | Freq: Every day | ORAL | 0 refills | Status: AC

## 2022-01-24 NOTE — ED Provider Notes (Signed)
I assumed care of this patient.  Please see previous provider note for further details of Hx, PE.  Briefly patient is a 41 y.o. male who presented right sided flank pain.   Initial work-up was reassuring with no leukocytosis or anemia.  No significant electrolyte derangements or renal sufficiency.  No evidence of bili obstruction or pancreatitis.  UA without evidence of infection.  Right upper quadrant ultrasound negative for cholelithiasis or acute cholecystitis.  Chest x-ray negative for pneumonia or pneumothorax.  D-dimer negative ruling out PE.  Cardiac markers were reassuring.  Plan was to have patient transferred to [facility] for [facility] scan.  On reassessment patient was wondered if he definitely needed the CT scan.  We went through another history and exam.  Patient is extremely tender to light touch along dermatomal region of the right flank.  Rest of the abdomen was relatively benign.  Upon questioning he did report having shingles in the past.  Given his history of prior Roux-en-Y and we did discuss possibility of internal hernia, but patient is denying any nausea vomiting.  He still having bowel movements and passing gas making this less likely.  Patient was given the option of still being transferred to [facility].  With shared decision making he opted to defer this.  We will pursue treatment for possible Preherpetic pain.  Close return precautions given.  Patient and family at bedside were all in agreement.  The patient appears reasonably screened and/or stabilized for discharge and I doubt any other medical condition or other [facility] requiring further screening, evaluation, or treatment in the ED at this time prior to discharge. Safe for discharge with strict return precautions.  Disposition: Discharge  Condition: Good  I have discussed the results, Dx and Tx plan with the patient/family who expressed understanding and agree(s) with the plan. Discharge instructions discussed at length. The  patient/family was given strict return precautions who verbalized understanding of the instructions. No further questions at time of discharge.    ED Discharge Orders          Ordered    lidocaine (LIDODERM) 5 %  Every 24 hours        01/24/22 0130    predniSONE (DELTASONE) 10 MG tablet  Daily        01/24/22 0130    oxyCODONE (ROXICODONE) 5 MG immediate release tablet  Every 6 hours PRN        01/24/22 0130    valACYclovir (VALTREX) 1000 MG tablet  3 times daily        01/24/22 2774            [facility] narcotic database reviewed and no active prescriptions noted.   Follow Up: Renford Dills, MD [address] [place] [address] [phone]  Call  to schedule an appointment for close follow up         Tomia Enlow, Amadeo Garnet, MD 01/24/22 [phone]

## 2022-01-24 NOTE — Discharge Instructions (Addendum)
For pain control you may take at 1000 mg of Tylenol every 8 hours scheduled.  In addition you can take 0.5 to 1 tablet of Oxycodone every 6 hours as needed for pain not controlled with the scheduled Tylenol. ? ?

## 2022-02-04 ENCOUNTER — Other Ambulatory Visit (HOSPITAL_COMMUNITY): Payer: Self-pay

## 2022-02-04 MED ORDER — WEGOVY 0.25 MG/0.5ML ~~LOC~~ SOAJ
SUBCUTANEOUS | 0 refills | Status: AC
Start: 2022-02-04 — End: ?
  Filled 2022-02-04: qty 2, 28d supply, fill #0

## 2022-02-04 MED ORDER — WEGOVY 1 MG/0.5ML ~~LOC~~ SOAJ
SUBCUTANEOUS | 0 refills | Status: AC
  Filled 2022-02-04: qty 2, 28d supply, fill #0

## 2022-02-05 ENCOUNTER — Other Ambulatory Visit (HOSPITAL_COMMUNITY): Payer: Self-pay

## 2022-02-17 ENCOUNTER — Other Ambulatory Visit (HOSPITAL_COMMUNITY): Payer: Self-pay

## 2022-02-19 ENCOUNTER — Other Ambulatory Visit (HOSPITAL_COMMUNITY): Payer: Self-pay

## 2022-03-09 DIAGNOSIS — J329 Chronic sinusitis, unspecified: Secondary | ICD-10-CM | POA: Diagnosis not present

## 2022-03-09 DIAGNOSIS — J4521 Mild intermittent asthma with (acute) exacerbation: Secondary | ICD-10-CM | POA: Diagnosis not present

## 2022-03-09 DIAGNOSIS — J302 Other seasonal allergic rhinitis: Secondary | ICD-10-CM | POA: Diagnosis not present

## 2022-03-25 ENCOUNTER — Other Ambulatory Visit: Payer: Self-pay

## 2022-05-18 DIAGNOSIS — M542 Cervicalgia: Secondary | ICD-10-CM | POA: Diagnosis not present

## 2022-05-19 DIAGNOSIS — K219 Gastro-esophageal reflux disease without esophagitis: Secondary | ICD-10-CM | POA: Diagnosis not present

## 2022-05-19 DIAGNOSIS — E7800 Pure hypercholesterolemia, unspecified: Secondary | ICD-10-CM | POA: Diagnosis not present

## 2022-05-19 DIAGNOSIS — R7989 Other specified abnormal findings of blood chemistry: Secondary | ICD-10-CM | POA: Diagnosis not present

## 2022-05-19 DIAGNOSIS — G4733 Obstructive sleep apnea (adult) (pediatric): Secondary | ICD-10-CM | POA: Diagnosis not present

## 2022-05-19 DIAGNOSIS — Z0000 Encounter for general adult medical examination without abnormal findings: Secondary | ICD-10-CM | POA: Diagnosis not present

## 2022-08-20 ENCOUNTER — Other Ambulatory Visit (HOSPITAL_COMMUNITY): Payer: Self-pay

## 2022-08-21 ENCOUNTER — Other Ambulatory Visit (HOSPITAL_COMMUNITY): Payer: Self-pay

## 2022-08-21 MED ORDER — ALBUTEROL SULFATE HFA 108 (90 BASE) MCG/ACT IN AERS
1.0000 | INHALATION_SPRAY | RESPIRATORY_TRACT | 2 refills | Status: AC | PRN
  Filled 2022-08-21: qty 6.7, 30d supply, fill #0

## 2022-08-31 ENCOUNTER — Other Ambulatory Visit (HOSPITAL_COMMUNITY): Payer: Self-pay

## 2022-09-07 ENCOUNTER — Other Ambulatory Visit (HOSPITAL_COMMUNITY): Payer: Self-pay

## 2023-06-16 DIAGNOSIS — E7800 Pure hypercholesterolemia, unspecified: Secondary | ICD-10-CM | POA: Diagnosis not present

## 2023-06-16 DIAGNOSIS — Z83719 Family history of colon polyps, unspecified: Secondary | ICD-10-CM | POA: Diagnosis not present

## 2023-06-16 DIAGNOSIS — G4733 Obstructive sleep apnea (adult) (pediatric): Secondary | ICD-10-CM | POA: Diagnosis not present

## 2023-06-16 DIAGNOSIS — Z0000 Encounter for general adult medical examination without abnormal findings: Secondary | ICD-10-CM | POA: Diagnosis not present

## 2023-06-16 DIAGNOSIS — J45909 Unspecified asthma, uncomplicated: Secondary | ICD-10-CM | POA: Diagnosis not present

## 2023-07-05 DIAGNOSIS — G4733 Obstructive sleep apnea (adult) (pediatric): Secondary | ICD-10-CM | POA: Diagnosis not present

## 2023-07-09 DIAGNOSIS — J988 Other specified respiratory disorders: Secondary | ICD-10-CM | POA: Diagnosis not present

## 2023-08-22 ENCOUNTER — Encounter (HOSPITAL_COMMUNITY): Payer: Self-pay | Admitting: *Deleted

## 2023-09-13 DIAGNOSIS — J069 Acute upper respiratory infection, unspecified: Secondary | ICD-10-CM | POA: Diagnosis not present

## 2023-09-13 DIAGNOSIS — Z03818 Encounter for observation for suspected exposure to other biological agents ruled out: Secondary | ICD-10-CM | POA: Diagnosis not present

## 2023-09-21 DIAGNOSIS — Z8709 Personal history of other diseases of the respiratory system: Secondary | ICD-10-CM | POA: Diagnosis not present

## 2023-09-21 DIAGNOSIS — R0600 Dyspnea, unspecified: Secondary | ICD-10-CM | POA: Diagnosis not present

## 2023-10-11 DIAGNOSIS — J40 Bronchitis, not specified as acute or chronic: Secondary | ICD-10-CM | POA: Diagnosis not present

## 2023-10-11 DIAGNOSIS — U071 COVID-19: Secondary | ICD-10-CM | POA: Diagnosis not present

## 2024-03-30 DIAGNOSIS — H52223 Regular astigmatism, bilateral: Secondary | ICD-10-CM | POA: Diagnosis not present

## 2024-04-10 DIAGNOSIS — J069 Acute upper respiratory infection, unspecified: Secondary | ICD-10-CM | POA: Diagnosis not present

## 2024-08-01 DIAGNOSIS — M79605 Pain in left leg: Secondary | ICD-10-CM | POA: Diagnosis not present

## 2024-08-01 DIAGNOSIS — M79662 Pain in left lower leg: Secondary | ICD-10-CM | POA: Diagnosis not present

## 2024-08-14 ENCOUNTER — Other Ambulatory Visit: Payer: Self-pay

## 2024-08-14 ENCOUNTER — Emergency Department (HOSPITAL_BASED_OUTPATIENT_CLINIC_OR_DEPARTMENT_OTHER): Admitting: Radiology

## 2024-08-14 ENCOUNTER — Emergency Department (HOSPITAL_BASED_OUTPATIENT_CLINIC_OR_DEPARTMENT_OTHER)
Admission: EM | Admit: 2024-08-14 | Discharge: 2024-08-15 | Disposition: A | Discharge status: Home / Self Care | Attending: Emergency Medicine | Admitting: Emergency Medicine

## 2024-08-14 ENCOUNTER — Encounter (HOSPITAL_BASED_OUTPATIENT_CLINIC_OR_DEPARTMENT_OTHER): Payer: Self-pay

## 2024-08-14 DIAGNOSIS — R0602 Shortness of breath: Secondary | ICD-10-CM | POA: Insufficient documentation

## 2024-08-14 DIAGNOSIS — B349 Viral infection, unspecified: Secondary | ICD-10-CM | POA: Insufficient documentation

## 2024-08-14 DIAGNOSIS — R509 Fever, unspecified: Secondary | ICD-10-CM | POA: Diagnosis present

## 2024-08-14 LAB — COMPREHENSIVE METABOLIC PANEL WITH GFR
ALT: 36 U/L (ref 0–44)
AST: 25 U/L (ref 15–41)
Albumin: 4.1 g/dL (ref 3.5–5.0)
Alkaline Phosphatase: 78 U/L (ref 38–126)
Anion gap: 11 (ref 5–15)
BUN: 18 mg/dL (ref 6–20)
CO2: 26 mmol/L (ref 22–32)
Calcium: 9.3 mg/dL (ref 8.9–10.3)
Chloride: 102 mmol/L (ref 98–111)
Creatinine, Ser: 0.82 mg/dL (ref 0.61–1.24)
GFR, Estimated: >60 mL/min
Glucose, Bld: 89 mg/dL (ref 70–99)
Potassium: 4 mmol/L (ref 3.5–5.1)
Sodium: 138 mmol/L (ref 135–145)
Total Bilirubin: 0.2 mg/dL (ref 0.0–1.2)
Total Protein: 7.1 g/dL (ref 6.5–8.1)

## 2024-08-14 LAB — CBC
HCT: 38.4 % — ABNORMAL LOW (ref 39.0–52.0)
Hemoglobin: 12.5 g/dL — ABNORMAL LOW (ref 13.0–17.0)
MCH: 27.5 pg (ref 26.0–34.0)
MCHC: 32.6 g/dL (ref 30.0–36.0)
MCV: 84.4 fL (ref 80.0–100.0)
Platelets: 234 K/uL (ref 150–400)
RBC: 4.55 MIL/uL (ref 4.22–5.81)
RDW: 13.2 % (ref 11.5–15.5)
WBC: 8.3 K/uL (ref 4.0–10.5)
nRBC: 0 % (ref 0.0–0.2)

## 2024-08-14 LAB — PRO BRAIN NATRIURETIC PEPTIDE: Pro Brain Natriuretic Peptide: <50 pg/mL

## 2024-08-14 LAB — TROPONIN T, HIGH SENSITIVITY: Troponin T High Sensitivity: <15 ng/L (ref 0–19)

## 2024-08-14 LAB — D-DIMER, QUANTITATIVE: D-Dimer, Quant: 0.33 ug{FEU}/mL (ref 0.00–0.50)

## 2024-08-14 MED ORDER — MORPHINE SULFATE (PF) 4 MG/ML IV SOLN
4.0000 mg | Freq: Once | INTRAVENOUS | Status: AC
  Administered 2024-08-14: 4 mg via INTRAVENOUS
  Filled 2024-08-14: qty 1

## 2024-08-14 NOTE — ED Provider Notes (Signed)
 " [place] EMERGENCY DEPARTMENT AT [facility] Provider Note   CSN: [id number] Arrival date & time: 08/14/24  2014     Patient presents with: Generalized Body Aches and Fever   [name] is a 43 y.o. male.  {Add pertinent medical, surgical, social history, OB history to HPI:32947}  Fever    43 year old male medical history significant for obesity, GERD, HLD, OSA on CPAP presenting to the emergency department with chief complaint of chest pain and shortness of breath.  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  He states that on Friday he started developing sharp chest pain with associated shortness of breath.  He has had some swelling and cramping in his left leg but endorses bilateral swelling in his lower extremities.  Denies any history of heart failure.  No history of DVT or PE.  He has had bodyaches and subjective fevers starting on Sunday.  He endorses pain on deep inspiration.  He also endorses orthopnea.  Had roughly a 10 pound or more weight gain in the past 2 weeks.  Denies any history of heart failure.  Prior to Admission medications  Medication Sig Start Date End Date Taking? Authorizing Provider  benzonatate (TESSALON) 100 MG capsule Take 1 capsule (100 mg total) by mouth every 8 (eight) hours. 08/15/24  Yes Jerrol Agent, MD  albuterol  (VENTOLIN  HFA) 108 (90 Base) MCG/ACT inhaler Inhale 1 puff into the lungs every 4 (four) hours as needed. 06/29/21     albuterol  (VENTOLIN  HFA) 108 (90 Base) MCG/ACT inhaler Inhale 1 puff into the lungs every 4 (four) hours as needed. 06/30/22     amitriptyline  (ELAVIL ) 10 MG tablet Take 1 tablet every night for 1 week, then increase to 2 tablets every night 04/25/20   Georjean Darice HERO, MD  atorvastatin (LIPITOR) 10 MG tablet Take 10 mg by mouth daily. 10/08/19   [provider]  Calcium-Vitamin D-Vitamin K 500-100-40 MG-UNT-MCG CHEW Chew 1 tablet by mouth daily. Patient not  taking: Reported on 04/25/2020    [provider]  cyclobenzaprine (FLEXERIL) 10 MG tablet Take 10 mg by mouth at bedtime. 04/15/20   [provider]  loratadine (CLARITIN) 10 MG tablet Take 10 mg by mouth daily as needed for allergies.    [provider]  Multiple Vitamins-Minerals (BARIATRIC FUSION PO) Take 1 capsule by mouth daily. Patient not taking: Reported on 04/25/2020    [provider]  naproxen (NAPROSYN) 500 MG tablet Take by mouth. 04/07/20   [provider]  Semaglutide -Weight Management (WEGOVY ) 0.25 MG/0.5ML SOAJ Inject 0.5 mLs (0.25 mg total) subcutaneously once a week for 28 days 02/04/22     Semaglutide -Weight Management (WEGOVY ) 1 MG/0.5ML SOAJ Inject 0.5 mLs (1 mg total) into the skin once a week for 28 days 02/04/22     tadalafil (CIALIS) 10 MG tablet Take 10 mg by mouth as needed. Patient not taking: Reported on 04/25/2020 07/24/19   [provider]    Allergies: Amoxicillin    Review of Systems  Constitutional:  Positive for fever.  All other systems reviewed and are negative.   Updated Vital Signs BP 118/62   Pulse 77   Temp 97.9 F (36.6 C) (Oral)   Resp 14   SpO2 98%   Physical Exam Vitals and nursing note reviewed.  Constitutional:      General: He is not in acute distress.    Appearance: He is well-developed. He is obese.  HENT:     Head: Normocephalic and atraumatic.  Eyes:     Conjunctiva/sclera: Conjunctivae normal.  Cardiovascular:     Rate and Rhythm: Normal rate and regular rhythm.     Heart sounds: No murmur heard. Pulmonary:     Effort: Pulmonary effort is normal. No respiratory distress.     Breath sounds: Normal breath sounds.  Abdominal:     Palpations: Abdomen is soft.     Tenderness: There is no abdominal tenderness.  Musculoskeletal:        General: No swelling.     Cervical back: Neck supple.  Skin:    General: Skin is warm and dry.     Capillary Refill: Capillary refill takes  less than 2 seconds.  Neurological:     Mental Status: He is alert.  Psychiatric:        Mood and Affect: Mood normal.     (all labs ordered are listed, but only abnormal results are displayed) Labs Reviewed  CBC - Abnormal; Notable for the following components:      Result Value   Hemoglobin 12.5 (*)    HCT 38.4 (*)    All other components within normal limits  URINALYSIS, W/ REFLEX TO CULTURE (INFECTION SUSPECTED) - Abnormal; Notable for the following components:   Color, Urine STRAW (*)    All other components within normal limits  RESP PANEL BY RT-PCR (RSV, FLU A&B, COVID)  RVPGX2  D-DIMER, QUANTITATIVE  COMPREHENSIVE METABOLIC PANEL WITH GFR  PRO BRAIN NATRIURETIC PEPTIDE  TROPONIN T, HIGH SENSITIVITY    EKG: None  Radiology: CT Angio Chest PE W and/or Wo Contrast Result Date: 08/15/2024 EXAM: CTA CHEST 08/15/2024 12:41:56 AM TECHNIQUE: CTA of the chest was performed without and with the administration of 100 mL of iohexol  (OMNIPAQUE ) 350 MG/ML injection. Multiplanar reformatted images are provided for review. MIP images are provided for review. Automated exposure control, iterative reconstruction, and/or weight based adjustment of the mA/kV was utilized to reduce the radiation dose to as low as reasonably achievable. COMPARISON: None available. CLINICAL HISTORY: Pleuritic chest pain, XR nondiagnostic. FINDINGS: PULMONARY ARTERIES: Pulmonary arteries are adequately opacified for evaluation. No acute pulmonary embolus. Main pulmonary artery is normal in caliber. MEDIASTINUM: The heart and pericardium demonstrate no acute abnormality. There is no acute abnormality of the thoracic aorta. LYMPH NODES: No mediastinal, hilar or axillary lymphadenopathy. LUNGS AND PLEURA: The lungs are without acute process. No focal consolidation or pulmonary edema. No evidence of pleural effusion or pneumothorax. UPPER ABDOMEN: Surgical changes of gastric bypass are identified. SOFT TISSUES AND BONES:  No acute bone or soft tissue abnormality. IMPRESSION: 1. No pulmonary embolism or acute pulmonary abnormality. 2. Status post gastric bypass. Electronically signed by: Dorethia Molt MD 08/15/2024 01:29 AM EST RP Workstation: [id number]   DG Chest 2 View Result Date: 08/14/2024 CLINICAL DATA:  Shortness of breath.  Fever and body aches. EXAM: CHEST - 2 VIEW COMPARISON:  09/21/2023 FINDINGS: The cardiomediastinal contours are normal. The lungs are clear. Pulmonary vasculature is normal. No consolidation, pleural effusion, or pneumothorax. No acute osseous abnormalities are seen. IMPRESSION: No active cardiopulmonary disease. Electronically Signed   By: Andrea Gasman M.D.   On: 08/14/2024 23:10    {Document cardiac monitor, telemetry assessment procedure when appropriate:32947} Procedures   Medications Ordered in the ED  morphine  (PF) 4 MG/ML injection 4 mg (4 mg Intravenous Given 08/14/24 2322)  iohexol  (OMNIPAQUE ) 350 MG/ML injection 100 mL (100 mLs Intravenous Contrast Given 08/15/24 0032)      {  Click here for ABCD2, HEART and other calculators REFRESH Note before signing:1}                              Medical Decision Making Amount and/or Complexity of Data Reviewed Labs: ordered. Radiology: ordered.  Risk Prescription drug management.    43 year old male medical history significant for obesity, GERD, HLD, OSA on CPAP presenting to the emergency department with chief complaint of chest pain and shortness of breath.  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  He states that on Friday he started developing sharp chest pain with associated shortness of breath.  He has had some swelling and cramping in his left leg but endorses bilateral swelling in his lower extremities.  Denies any history of heart failure.  No history of DVT or PE.  He has had bodyaches and subjective fevers starting on "Sunday.  He endorses pain on deep inspiration.  He also  endorses orthopnea.  Had roughly a 10 pound or more weight gain in the past 2 weeks.  Denies any history of heart failure.  ***  {Document critical care time when appropriate  Document review of labs and clinical decision tools ie CHADS2VASC2, etc  Document your independent review of radiology images and any outside records  Document your discussion with family members, caretakers and with consultants  Document social determinants of health affecting pt's care  Document your decision making why or why not admission, treatments were needed:32947:::1}   Final diagnoses:  Acute viral syndrome    ED Discharge Orders          Ordered    benzonatate (TESSALON) 100 MG capsule  Every 8 hours        12" /31/25 0243             "

## 2024-08-14 NOTE — ED Notes (Signed)
[facility]

## 2024-08-14 NOTE — ED Triage Notes (Signed)
 Pt presents via body aches and fever starting on Sunday. Reports SOB and pain with inspiration.

## 2024-08-15 ENCOUNTER — Other Ambulatory Visit (HOSPITAL_COMMUNITY): Payer: Self-pay

## 2024-08-15 ENCOUNTER — Emergency Department (HOSPITAL_BASED_OUTPATIENT_CLINIC_OR_DEPARTMENT_OTHER)

## 2024-08-15 LAB — RESP PANEL BY RT-PCR (RSV, FLU A&B, COVID)  RVPGX2
Influenza A by PCR: NEGATIVE
Influenza B by PCR: NEGATIVE
Resp Syncytial Virus by PCR: NEGATIVE
SARS Coronavirus 2 by RT PCR: NEGATIVE

## 2024-08-15 LAB — URINALYSIS, W/ REFLEX TO CULTURE (INFECTION SUSPECTED)
Bacteria, UA: NONE SEEN
Bilirubin Urine: NEGATIVE
Glucose, UA: NEGATIVE mg/dL
Hgb urine dipstick: NEGATIVE
Ketones, ur: NEGATIVE mg/dL
Leukocytes,Ua: NEGATIVE
Nitrite: NEGATIVE
Protein, ur: NEGATIVE mg/dL
Specific Gravity, Urine: 1.025 (ref 1.005–1.030)
Squamous Epithelial / HPF: NONE SEEN /HPF (ref 0–5)
pH: 6 (ref 5.0–8.0)

## 2024-08-15 MED ORDER — IOHEXOL 350 MG/ML SOLN
100.0000 mL | Freq: Once | INTRAVENOUS | Status: AC | PRN
  Administered 2024-08-15: 100 mL via INTRAVENOUS

## 2024-08-15 MED ORDER — BENZONATATE 100 MG PO CAPS
100.0000 mg | ORAL_CAPSULE | Freq: Three times a day (TID) | ORAL | 0 refills | Status: AC
  Filled 2024-08-15: qty 21, 7d supply, fill #0

## 2024-08-15 NOTE — Discharge Instructions (Addendum)
 Your symptoms are consistent with an acute viral illness.  Your workup to include EKG, chest x-ray, CT imaging, laboratory evaluation was overall reassuring.  Take Tylenol  and Motrin  for muscle aches and fever, Tessalon has been prescribed for cough, follow-up with your primary care provider to ensure resolution of symptoms.

## 2024-08-15 NOTE — ED Notes (Signed)
 Patient transported to CT

## 2024-08-17 ENCOUNTER — Other Ambulatory Visit (HOSPITAL_COMMUNITY): Payer: Self-pay

## 2024-08-24 ENCOUNTER — Encounter (HOSPITAL_COMMUNITY): Payer: Self-pay | Admitting: *Deleted
